# Patient Record
Sex: Female | Born: 1990 | Race: Black or African American | Hispanic: No | Marital: Single | Smoking: Never smoker
Health system: Southern US, Community
[De-identification: ages and names within clinical notes are randomized; demographics above are authoritative.]

## PROBLEM LIST (undated history)

## (undated) ENCOUNTER — Inpatient Hospital Stay (HOSPITAL_COMMUNITY): Payer: Self-pay

## (undated) DIAGNOSIS — O4290 Premature rupture of membranes, unspecified as to length of time between rupture and onset of labor, unspecified weeks of gestation: Secondary | ICD-10-CM

## (undated) DIAGNOSIS — J020 Streptococcal pharyngitis: Secondary | ICD-10-CM

## (undated) DIAGNOSIS — B999 Unspecified infectious disease: Secondary | ICD-10-CM

## (undated) DIAGNOSIS — O139 Gestational [pregnancy-induced] hypertension without significant proteinuria, unspecified trimester: Secondary | ICD-10-CM

## (undated) DIAGNOSIS — A749 Chlamydial infection, unspecified: Secondary | ICD-10-CM

## (undated) DIAGNOSIS — D649 Anemia, unspecified: Secondary | ICD-10-CM

## (undated) HISTORY — PX: WISDOM TOOTH EXTRACTION: SHX21

## (undated) HISTORY — PX: THERAPEUTIC ABORTION: SHX798

## (undated) HISTORY — DX: Unspecified infectious disease: B99.9

---
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
## 2009-02-27 ENCOUNTER — Ambulatory Visit: Payer: Self-pay | Admitting: Family Medicine

## 2009-02-27 ENCOUNTER — Encounter: Payer: Self-pay | Admitting: Family Medicine

## 2009-02-27 DIAGNOSIS — N898 Other specified noninflammatory disorders of vagina: Secondary | ICD-10-CM | POA: Insufficient documentation

## 2009-02-27 LAB — CONVERTED CEMR LAB
Chlamydia, DNA Probe: NEGATIVE
Glucose, Urine, Semiquant: NEGATIVE
Urobilinogen, UA: 0.2
WBC Urine, dipstick: NEGATIVE
pH: 6.5

## 2009-02-28 ENCOUNTER — Encounter: Payer: Self-pay | Admitting: Family Medicine

## 2009-03-04 ENCOUNTER — Ambulatory Visit: Payer: Self-pay | Admitting: Family Medicine

## 2009-03-28 ENCOUNTER — Emergency Department (HOSPITAL_COMMUNITY): Admission: EM | Admit: 2009-03-28 | Discharge: 2009-03-28 | Payer: Self-pay | Admitting: Emergency Medicine

## 2009-03-30 ENCOUNTER — Emergency Department (HOSPITAL_COMMUNITY): Admission: EM | Admit: 2009-03-30 | Discharge: 2009-03-30 | Payer: Self-pay | Admitting: Family Medicine

## 2009-05-28 ENCOUNTER — Ambulatory Visit: Payer: Self-pay | Admitting: Family Medicine

## 2009-05-28 ENCOUNTER — Encounter: Payer: Self-pay | Admitting: Family Medicine

## 2009-05-28 DIAGNOSIS — B373 Candidiasis of vulva and vagina: Secondary | ICD-10-CM | POA: Insufficient documentation

## 2009-05-28 DIAGNOSIS — D649 Anemia, unspecified: Secondary | ICD-10-CM

## 2009-05-28 LAB — CONVERTED CEMR LAB: Whiff Test: NEGATIVE

## 2009-05-29 ENCOUNTER — Encounter: Payer: Self-pay | Admitting: Family Medicine

## 2009-05-29 LAB — CONVERTED CEMR LAB: GC Probe Amp, Genital: NEGATIVE

## 2009-08-15 ENCOUNTER — Ambulatory Visit: Payer: Self-pay | Admitting: Family Medicine

## 2009-11-01 ENCOUNTER — Encounter: Payer: Self-pay | Admitting: *Deleted

## 2009-11-01 ENCOUNTER — Ambulatory Visit: Payer: Self-pay | Admitting: Family Medicine

## 2010-03-23 ENCOUNTER — Encounter: Payer: Self-pay | Admitting: Family Medicine

## 2010-04-01 NOTE — Assessment & Plan Note (Signed)
Summary: depo,tcb  Nurse Visit   Allergies: No Known Drug Allergies  Medication Administration  Injection # 1:    Medication: Depo-Provera 150mg     Diagnosis: CONTRACEPTIVE MANAGEMENT (ICD-V25.09)    Route: IM    Site: R deltoid    Exp Date: 06/2011    Lot #: [id number]    Mfr: Pharmacia    Comments: next Depo due March 21 thru June 03, 2009    Patient tolerated injection without complications    Given by: Theresia Lo RN (March 04, 2009 2:51 PM)  Orders Added: 1)  Depo-Provera 150mg  [J1055] 2)  Admin of Injection (IM/SQ) [19147]   Medication Administration  Injection # 1:    Medication: Depo-Provera 150mg     Diagnosis: CONTRACEPTIVE MANAGEMENT (ICD-V25.09)    Route: IM    Site: R deltoid    Exp Date: 06/2011    Lot #: [id number]    Mfr: Pharmacia    Comments: next Depo due March 21 thru June 03, 2009    Patient tolerated injection without complications    Given by: Theresia Lo RN (March 04, 2009 2:51 PM)  Orders Added: 1)  Depo-Provera 150mg  [J1055] 2)  Admin of Injection (IM/SQ) [13086]

## 2010-04-01 NOTE — Assessment & Plan Note (Signed)
Summary: cpe/depo,df   Vital Signs:  Patient profile:   20 year old female Weight:      101.9 pounds Temp:     98.7 degrees F oral Pulse rate:   90 / minute BP sitting:   108 / 74  (left arm) Cuff size:   regular  Vitals Entered By: Loralee Pacas CMA (May 28, 2009 8:57 AM)  CC:  cpe.  History of Present Illness: here for cpe.  has no concerns to discuss today  Current Medications (verified): 1)  Depo Shot Q 12 Weeks  Allergies: No Known Drug Allergies  Past History:  Past Medical History: Reviewed history from 01/03/2008 and no changes required. 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 G1P1  Family History: father--DM2 mother--healthy sister--DM2 paternal uncle with cancer--unknown type paternal GF (?colon cancer)  Social History: lives  her son (age 21)  [employer] studying nursing.  no  smoking, alcohol, or recreational drug use.  Sexually active  Review of Systems General:  Denies fever and weight loss. Resp:  Denies chest pain with inspiration. GU:  Denies abnormal vaginal bleeding.  Physical Exam  General:  Well-developed,well-nourished,in no acute distress; alert,appropriate and cooperative throughout examination Eyes:  fundoscopic exam grossly normal Ears:  tms clear Nose:  External nasal examination shows no deformity or inflammation. Nasal mucosa are pink and moist without lesions or exudates. Mouth:  Oral mucosa and oropharynx without lesions or exudates.  Teeth in good repair. Neck:  No deformities, masses, or tenderness noted. Breasts:  No mass, nodules, thickening, tenderness, bulging, retraction, inflamation, nipple discharge or skin changes noted.   Lungs:  Normal respiratory effort, chest expands symmetrically. Lungs are clear to auscultation, no crackles or wheezes. Heart:  Normal rate and regular rhythm. S1 and S2 normal  without gallop, murmur, click, rub or other extra sounds. Abdomen:  Bowel sounds positive,abdomen soft and non-tender without masses, organomegaly or hernias noted. Genitalia:  Pelvic Exam:        External: normal female genitalia without lesions or masses        Vagina: normal without lesions or masses        Cervix: normal without lesions or masses        Adnexa: normal bimanual exam without masses or fullness        Uterus: normal by palpation        Pap smear: not performed Msk:  No deformity or scoliosis noted of thoracic or lumbar spine.   Extremities:  No clubbing, cyanosis, edema, or deformity noted  Neurologic:  no focal deficits Skin:  Intact without suspicious lesions or rashes Axillary Nodes:  No palpable lymphadenopathy Psych:  Cognition and judgment appear intact. Alert and cooperative with normal attention span and concentration. No apparent delusions, illusions, hallucinations Additional Exam:  vital signs reviewed    Impression & Recommendations:  Problem # 1:  Preventive Health Care (ICD-V70.0) Assessment Unchanged fasting glucose done today is normal.  with 2 first degree relatives with diabetes, should be screened every 1-2 years.  Problem # 2:  Hx of ANEMIA (ICD-285.9) Assessment: Unchanged  hgb normal today at 13.8 Orders: Hemoglobin-[facility] (96045)  Problem # 3:  CONTACT OR EXPOSURE TO OTHER VIRAL DISEASES (ICD-V01.79) Assessment: Unchanged check for STDs as requested Orders: RPR-[facility] (000111000111) HIV-[facility] (40981-19147) GC/Chlamydia-[facility] (87591/87491)  Complete Medication List: 1)  Depo Shot Q 12 Weeks   Other Orders: Depo-Provera 150mg  (W2956)  Glucose Cap-[facility] (16109) Wet Prep- [facility] (60454) [facility] - Est  18-39 yrs (09811)  Patient Instructions: 1)  It was nice to see you today. 2)  I will call you or send you a letter with your lab results. 3)  Please schedule a follow-up appointment in 1 year.    Medication Administration  Injection # 1:     Medication: Depo-Provera 150mg     Diagnosis: CONTRACEPTIVE MANAGEMENT (ICD-V25.09)    Route: IM    Site: L deltoid    Exp Date: 07/01/2011    Lot #: [id number]    Mfr: [email or web address]    Comments: next injection due 08/13/2009 thru 08/27/2009    Patient tolerated injection without complications    Given by: Loralee Pacas CMA (May 28, 2009 9:10 AM)  Orders Added: 1)  Depo-Provera 150mg  [J1055] 2)  Hemoglobin-[facility] [85018] 3)  Glucose Cap-[facility] [82948] 4)  RPR-[facility] [86592-23940] 5)  HIV-[facility] [95621-30865] 6)  Wet Prep- [facility] [87210] 7)  GC/Chlamydia-[facility] [87591/87491] 8)  [facility] - Est  18-39 yrs [99395]   Laboratory Results   Blood Tests   Date/Time Received: May 28, 2009 9:45 AM  Date/Time Reported: May 28, 2009 10:08 AM     CBC   HGB:  13.8 g/dL   (Normal Range: 78.4-69.6 in Males, 12.0-15.0 in Females) Comments: ...........test performed by............Marland KitchenDewitt Hoes, [place](ASCP)10:08 AM entered by Terese Door, CMA    Date/Time Received: May 28, 2009 9:45 AM  Date/Time Reported: May 28, 2009 10:07 AM   [employer] Source: vaginal WBC/hpf: 0-3 Bacteria/hpf: 3+  Rods Clue cells/hpf: none  Negative whiff Yeast/hpf: none Trichomonas/hpf: none Comments: ...........test performed by...........Marland KitchenTerese Door, CMA    Appended Document: Orders Update    Clinical Lists Changes  Problems: Added new problem of WELL CHILD EXAMINATION (ICD-V20.2) Added new problem of ROUTINE GYNECOLOGICAL EXAMINATION (ICD-V72.31) Orders: Added new Test order of [facility] - Est  18-39 yrs [phone]) - Signed      Appended Document: Orders Update    Clinical Lists Changes  Orders: Added new Test order of [facility]- New 18-26yrs [phone]) - Signed

## 2010-04-01 NOTE — Assessment & Plan Note (Signed)
Summary: depo provera  Nurse Visit   Allergies: No Known Drug Allergies  Medication Administration  Injection # 1:    Medication: Depo-Provera 150mg     Diagnosis: CONTRACEPTIVE MANAGEMENT (ICD-V25.09)    Route: IM    Site: L deltoid    Exp Date: 04/2012    Lot #: [id number]    Mfr: [email or web address]    Comments: next depo due Nov 18 through Feb 16, 2010    Patient tolerated injection without complications    Given by: Theresia Lo RN (November 01, 2009 2:31 PM)  Orders Added: 1)  Depo-Provera 150mg  [J1055] 2)  Admin of Injection (IM/SQ) [63875]   Medication Administration  Injection # 1:    Medication: Depo-Provera 150mg     Diagnosis: CONTRACEPTIVE MANAGEMENT (ICD-V25.09)    Route: IM    Site: L deltoid    Exp Date: 04/2012    Lot #: [id number]    Mfr: [email or web address]    Comments: next depo due Nov 18 through Feb 16, 2010    Patient tolerated injection without complications    Given by: Theresia Lo RN (November 01, 2009 2:31 PM)  Orders Added: 1)  Depo-Provera 150mg  [J1055] 2)  Admin of Injection (IM/SQ) [51884]

## 2010-04-01 NOTE — Assessment & Plan Note (Signed)
Summary: depo inj,tcb  Nurse Visit   Allergies: No Known Drug Allergies  Medication Administration  Injection # 1:    Medication: Depo-Provera 150mg     Diagnosis: CONTRACEPTIVE MANAGEMENT (ICD-V25.09)    Route: IM    Site: R deltoid    Exp Date: 04/2012    Lot #: [id number]    Mfr: [email or web address]    Comments: next depo due Sept 1 thru Sept 15, 2011    Patient tolerated injection without complications    Given by: Theresia Lo RN (August 15, 2009 5:04 PM)  Orders Added: 1)  Depo-Provera 150mg  [J1055] 2)  Admin of Injection (IM/SQ) [04540]   Medication Administration  Injection # 1:    Medication: Depo-Provera 150mg     Diagnosis: CONTRACEPTIVE MANAGEMENT (ICD-V25.09)    Route: IM    Site: R deltoid    Exp Date: 04/2012    Lot #: [id number]    Mfr: [email or web address]    Comments: next depo due Sept 1 thru Sept 15, 2011    Patient tolerated injection without complications    Given by: Theresia Lo RN (August 15, 2009 5:04 PM)  Orders Added: 1)  Depo-Provera 150mg  [J1055] 2)  Admin of Injection (IM/SQ) [47829]

## 2010-04-01 NOTE — Letter (Signed)
Summary: Generic Letter  [facility]  [address]   [place], [place] [address]   Phone: [phone]  Fax: [phone]    05/29/2009  Shelley Mckinney [address] [place], [place]  [address]  Dear Ms. SHERLIN,   I just wanted to let you know that your lab results were normal.  Please call me if you have any questions or concerns.           Sincerely,   Asher Muir MD  Appended Document: Generic Letter mailed.

## 2010-04-08 ENCOUNTER — Emergency Department (HOSPITAL_COMMUNITY)
Admission: EM | Admit: 2010-04-08 | Discharge: 2010-04-08 | Disposition: A | Discharge status: Home / Self Care | Payer: Self-pay | Attending: Emergency Medicine | Admitting: Emergency Medicine

## 2010-04-08 DIAGNOSIS — Z043 Encounter for examination and observation following other accident: Secondary | ICD-10-CM | POA: Insufficient documentation

## 2010-04-18 ENCOUNTER — Encounter: Payer: Self-pay | Admitting: *Deleted

## 2010-05-18 LAB — WET PREP, GENITAL

## 2010-05-18 LAB — POCT URINALYSIS DIP (DEVICE)
Glucose, UA: NEGATIVE mg/dL
pH: 7 (ref 5.0–8.0)

## 2010-05-18 LAB — GC/CHLAMYDIA PROBE AMP, GENITAL
Chlamydia, DNA Probe: NEGATIVE
GC Probe Amp, Genital: NEGATIVE

## 2010-06-04 ENCOUNTER — Emergency Department (HOSPITAL_COMMUNITY)
Admission: EM | Admit: 2010-06-04 | Discharge: 2010-06-04 | Disposition: A | Discharge status: Home / Self Care | Payer: Worker's Compensation | Attending: Emergency Medicine | Admitting: Emergency Medicine

## 2010-06-04 DIAGNOSIS — T25229A Burn of second degree of unspecified foot, initial encounter: Secondary | ICD-10-CM | POA: Insufficient documentation

## 2010-06-04 DIAGNOSIS — T310 Burns involving less than 10% of body surface: Secondary | ICD-10-CM | POA: Insufficient documentation

## 2010-06-04 DIAGNOSIS — X118XXA Contact with other hot tap-water, initial encounter: Secondary | ICD-10-CM | POA: Insufficient documentation

## 2010-06-04 DIAGNOSIS — Y9289 Other specified places as the place of occurrence of the external cause: Secondary | ICD-10-CM | POA: Insufficient documentation

## 2010-06-04 DIAGNOSIS — M79609 Pain in unspecified limb: Secondary | ICD-10-CM | POA: Insufficient documentation

## 2010-06-05 LAB — GC/CHLAMYDIA PROBE AMP, GENITAL: GC Probe Amp, Genital: NEGATIVE

## 2010-06-05 LAB — WET PREP, GENITAL

## 2010-06-06 ENCOUNTER — Inpatient Hospital Stay (INDEPENDENT_AMBULATORY_CARE_PROVIDER_SITE_OTHER)
Admission: RE | Admit: 2010-06-06 | Discharge: 2010-06-06 | Disposition: A | Discharge status: Home / Self Care | Payer: Worker's Compensation | Source: Ambulatory Visit | Attending: Emergency Medicine | Admitting: Emergency Medicine

## 2010-06-06 DIAGNOSIS — T25029A Burn of unspecified degree of unspecified foot, initial encounter: Secondary | ICD-10-CM

## 2010-06-17 LAB — POCT URINALYSIS DIP (DEVICE)
Glucose, UA: NEGATIVE mg/dL
Nitrite: NEGATIVE
Specific Gravity, Urine: 1.015 (ref 1.005–1.030)
Urobilinogen, UA: 0.2 mg/dL (ref 0.0–1.0)

## 2010-06-17 LAB — POCT PREGNANCY, URINE: Preg Test, Ur: NEGATIVE

## 2010-06-17 LAB — GC/CHLAMYDIA PROBE AMP, GENITAL
Chlamydia, DNA Probe: NEGATIVE
GC Probe Amp, Genital: NEGATIVE

## 2010-06-20 ENCOUNTER — Emergency Department (HOSPITAL_COMMUNITY)
Admission: EM | Admit: 2010-06-20 | Discharge: 2010-06-20 | Disposition: A | Discharge status: Home / Self Care | Payer: Self-pay | Attending: Emergency Medicine | Admitting: Emergency Medicine

## 2010-06-20 DIAGNOSIS — O210 Mild hyperemesis gravidarum: Secondary | ICD-10-CM | POA: Insufficient documentation

## 2010-06-20 LAB — URINALYSIS, ROUTINE W REFLEX MICROSCOPIC
Glucose, UA: NEGATIVE mg/dL
Specific Gravity, Urine: 1.029 (ref 1.005–1.030)

## 2010-06-20 LAB — URINE MICROSCOPIC-ADD ON

## 2010-07-15 NOTE — Discharge Summary (Signed)
Shelley Mckinney, KELSAY                ACCOUNT NO.:  [id number]   MEDICAL RECORD NO.:  [id number]          PATIENT TYPE:  INP   LOCATION:  9117                          FACILITY:  [facility]   PHYSICIAN:  Norton Blizzard, MD    DATE OF BIRTH:  06-Nov-1990   DATE OF ADMISSION:  02/01/2007  DATE OF DISCHARGE:                               DISCHARGE SUMMARY   REASON FOR ADMISSION:  Onset of labor, spontaneous rupture of membranes.   PROCEDURES PRENATAL:  None.   PROCEDURES INTRAPARTUM:  Vacuum-assisted vaginal delivery.   PROCEDURES POSTPARTUM:  Rh immunoglobulin administration.   COMPLICATIONS, OPERATIVE AND POSTPARTUM:  None.   DISCHARGE DIAGNOSIS:  Term pregnancy delivered.   COMMENTS AND BRIEF HOSPITAL COURSE:  The patient is a 20 year old G1, P0  who presented at 6 and four-sevenths weeks following spontaneous  rupture of membranes.  The patient was a patient of the [facility] with onset of prenatal care at 13 weeks.  She was Rh negative and  GBS negative with a routine prenatal course.  The patient was admitted  to labor and delivery for expected management following rupture with  clear fluid return.  The patient was monitored overnight with  appropriate progression of her labor.  After reaching full cervical  dilation and effacement the patient commenced pushing for approximately  an hour to an hour-and-a-half.  The infant was occiput posterior.  Following the hour of pushing and secondary to maternal fatigue, Dr.  Okey Dupre was called and decision was made to use vacuum assistance to clear  the pelvic bone.  Vacuum was applied in the usual manner and the patient  delivered infant with one push over an intact perineum.  A single  shoulder cord was reduced.  Infant was bulb-suctioned at delivery.  Immediate cry was returned and infant's Apgars were 8 and 9 at one and  five minutes respectively.  Infant was handed to nurse.  The patient  delivered placenta spontaneously.   Three-vessel cord.  There were no  lacerations.  Estimated blood loss was minimal.  The patient and infant  were both stable following delivery.  Depo-Provera and RhoGAM were  administered to the patient postpartum per patient request and protocol,  respectively.  The patient had a routine postpartum course and was  discharged home on postpartum day #2.  Discharge hemoglobin was 9.0.  The patient is breast-feeding infant.  She is scheduled for followup  with [facility] with Dr. Vena Austria in 5 days.     Towana Badger, M.D.      Norton Blizzard, MD  Electronically Signed   JP/MEDQ  D:  02/04/2007  T:  02/04/2007  Job:  [id number]

## 2010-11-20 LAB — WET PREP, GENITAL: WBC, Wet Prep HPF POC: NONE SEEN

## 2010-11-20 LAB — POCT URINALYSIS DIP (DEVICE)
Hgb urine dipstick: NEGATIVE
Protein, ur: 30 — AB
Specific Gravity, Urine: 1.025
Urobilinogen, UA: 1

## 2010-11-20 LAB — GC/CHLAMYDIA PROBE AMP, GENITAL: Chlamydia, DNA Probe: NEGATIVE

## 2010-12-08 LAB — CBC
Hemoglobin: 11 — ABNORMAL LOW
MCV: 85.3
RBC: 3.18 — ABNORMAL LOW
RBC: 3.97
RDW: 15.8 — ABNORMAL HIGH
WBC: 11.5

## 2010-12-08 LAB — RH IMMUNE GLOB WKUP(>/=20WKS)(NOT WOMEN'S HOSP)

## 2010-12-08 LAB — RPR: RPR Ser Ql: NONREACTIVE

## 2010-12-09 LAB — URINALYSIS, DIPSTICK ONLY
Ketones, ur: NEGATIVE
Leukocytes, UA: NEGATIVE
Nitrite: NEGATIVE
Protein, ur: 30 — AB
Urobilinogen, UA: 0.2
pH: 7.5

## 2010-12-09 LAB — URINALYSIS, ROUTINE W REFLEX MICROSCOPIC
Bilirubin Urine: NEGATIVE
Ketones, ur: >80 — AB
Nitrite: NEGATIVE
Protein, ur: NEGATIVE
Urobilinogen, UA: 0.2

## 2010-12-09 LAB — COMPREHENSIVE METABOLIC PANEL
ALT: 17
AST: 29
Albumin: 2.6 — ABNORMAL LOW
Calcium: 9.3
Potassium: 3.9
Sodium: 133 — ABNORMAL LOW
Total Protein: 6.2

## 2010-12-09 LAB — CBC
MCHC: 32.9
MCV: 83.1
RDW: 16 — ABNORMAL HIGH

## 2011-06-11 ENCOUNTER — Encounter (HOSPITAL_COMMUNITY): Payer: Self-pay | Admitting: Emergency Medicine

## 2011-06-11 ENCOUNTER — Emergency Department (INDEPENDENT_AMBULATORY_CARE_PROVIDER_SITE_OTHER)
Admission: EM | Admit: 2011-06-11 | Discharge: 2011-06-11 | Disposition: A | Discharge status: Home / Self Care | Payer: Self-pay | Source: Home / Self Care | Attending: Emergency Medicine | Admitting: Emergency Medicine

## 2011-06-11 DIAGNOSIS — Z202 Contact with and (suspected) exposure to infections with a predominantly sexual mode of transmission: Secondary | ICD-10-CM

## 2011-06-11 LAB — POCT URINALYSIS DIP (DEVICE)
Protein, ur: 30 mg/dL — AB
Specific Gravity, Urine: 1.025 (ref 1.005–1.030)
Urobilinogen, UA: 1 mg/dL (ref 0.0–1.0)
pH: 5.5 (ref 5.0–8.0)

## 2011-06-11 MED ORDER — AZITHROMYCIN 250 MG PO TABS
1000.0000 mg | ORAL_TABLET | Freq: Once | ORAL | Status: AC
  Administered 2011-06-11: 1000 mg via ORAL

## 2011-06-11 NOTE — ED Notes (Signed)
CALLED PT AND TOLD HER THAT LAB DISCARDED GC/WET PREP CX DUE TO NON LABEL SPECIMEN.PT WAS TREATED WITH 1,000MG  AZITHROMYCIN PO  FOR  EXPOSURE OF CHLAMYDIA  FROM PARTNER.TOLD PT THE SITUATION AND TOLD HER TO RETURN IN 1 WEEK IF ANY SX APPEAR PER DR.COLL.PT VERBALIZED UNDERSTANDING

## 2011-06-11 NOTE — ED Provider Notes (Signed)
History     CSN: [id number]  Arrival date & time 06/11/11  1433   First MD Initiated Contact with Patient 06/11/11 1556      Chief Complaint  Patient presents with  . Exposure to STD    (Consider location/radiation/quality/duration/timing/severity/associated sxs/prior treatment) HPI Comments: Patient wants to be treated, for STD as her boyfriend, was diagnosed with chlamydia. She has no, symptoms, no discharge, no dysuria, no pelvic pain  Patient is a 21 y.o. female presenting with STD exposure. The history is provided by the patient.  Exposure to STD This is a new problem. The problem has not changed since onset.Pertinent negatives include no headaches. The symptoms are aggravated by nothing. The symptoms are relieved by nothing. She has tried nothing for the symptoms. The treatment provided no relief.    History reviewed. No pertinent past medical history.  History reviewed. No pertinent past surgical history.  No family history on file.  History  Substance Use Topics  . Smoking status: Not on file  . Smokeless tobacco: Not on file  . Alcohol Use: Not on file    OB History    Grav Para Term Preterm Abortions TAB SAB Ect Mult Living                  Review of Systems  Constitutional: Negative for activity change.  Genitourinary: Negative for dysuria, decreased urine volume, vaginal bleeding, vaginal discharge, vaginal pain, pelvic pain and dyspareunia.  Neurological: Negative for facial asymmetry, weakness, light-headedness and headaches.    Allergies  Review of patient's allergies indicates no known allergies.  Home Medications   Current Outpatient Rx  Name Route Sig Dispense Refill  . MEDROXYPROGESTERONE ACETATE 150 MG/ML IM SUSP Intramuscular Inject 150 mg into the muscle every 3 (three) months.        BP 107/58  Pulse 84  Temp(Src) 98.4 F (36.9 C) (Oral)  Resp 14  SpO2 100%  LMP 06/10/2011  Physical Exam  Nursing note and vitals  reviewed. Constitutional: She appears well-developed and well-nourished.  HENT:  Head: Normocephalic.  Mouth/Throat: No oropharyngeal exudate.  Eyes: Conjunctivae are normal.  Musculoskeletal: Normal range of motion. She exhibits no edema and no tenderness.  Neurological: She is alert. She displays normal reflexes. No cranial nerve deficit. She exhibits normal muscle tone. Coordination normal.  Skin: Skin is warm. No erythema.    ED Course  Procedures (including critical care time)  Labs Reviewed  POCT URINALYSIS DIP (DEVICE) - Abnormal; Notable for the following:    Bilirubin Urine SMALL (*)    Ketones, ur >=160 (*)    Hgb urine dipstick MODERATE (*)    Protein, ur 30 (*)    All other components within normal limits  POCT PREGNANCY, URINE   No results found.   1. Sexually transmitted disease exposure       MDM  Partner diagnosed- with chlamydia- approached by nurse, labs obtained but problem with identifiers.        Jimmie Molly, MD 06/11/11 [phone]

## 2011-06-11 NOTE — ED Notes (Signed)
PT HERE FOR STD CHECK WITH EXPOSURE FROM BOYFRIEND.PT WAS TOLD YESTERDAY PARTNER HAS CHLAMYDIA  BUT DENIES ANY SX.LMP YESTERDAY.

## 2011-06-13 ENCOUNTER — Emergency Department (HOSPITAL_COMMUNITY)
Admission: EM | Admit: 2011-06-13 | Discharge: 2011-06-13 | Disposition: A | Discharge status: Home / Self Care | Payer: Self-pay | Attending: Emergency Medicine | Admitting: Emergency Medicine

## 2011-06-13 ENCOUNTER — Encounter (HOSPITAL_COMMUNITY): Payer: Self-pay | Admitting: Family Medicine

## 2011-06-13 DIAGNOSIS — R209 Unspecified disturbances of skin sensation: Secondary | ICD-10-CM | POA: Insufficient documentation

## 2011-06-13 DIAGNOSIS — R631 Polydipsia: Secondary | ICD-10-CM | POA: Insufficient documentation

## 2011-06-13 DIAGNOSIS — E876 Hypokalemia: Secondary | ICD-10-CM | POA: Insufficient documentation

## 2011-06-13 LAB — GLUCOSE, CAPILLARY: Glucose-Capillary: 115 mg/dL — ABNORMAL HIGH (ref 70–99)

## 2011-06-13 LAB — POCT I-STAT, CHEM 8
Chloride: 106 mEq/L (ref 96–112)
Creatinine, Ser: 0.7 mg/dL (ref 0.50–1.10)
Hemoglobin: 13.6 g/dL (ref 12.0–15.0)
Potassium: 3.2 mEq/L — ABNORMAL LOW (ref 3.5–5.1)
Sodium: 141 mEq/L (ref 135–145)

## 2011-06-13 MED ORDER — POTASSIUM CHLORIDE ER 10 MEQ PO TBCR: 10.0000 meq | EXTENDED_RELEASE_TABLET | Freq: Two times a day (BID) | ORAL | Status: DC

## 2011-06-13 NOTE — ED Notes (Signed)
Pt sts she has had increased thirst, numbness in fingers, and not feeling herself. sts diabetes runs in family and wants to be checked.

## 2011-06-13 NOTE — ED Provider Notes (Signed)
History     CSN: [id number]  Arrival date & time 06/13/11  1847   First MD Initiated Contact with Patient 06/13/11 2015      Chief Complaint  Patient presents with  . Numbness  . Polydipsia    (Consider location/radiation/quality/duration/timing/severity/associated sxs/prior treatment) HPI Comments: Patient here with a week history of increased thirst and tingling sensation in her fingers, and states that she does not feel herself and is concerned that she may have diabetes. States that diabetes runs in her family. Denies polyuria, polyphagia, states has not been today. Denies fevers chills, headache, blurred vision, chest pain, shortness of breath, abdominal pain, nausea, vomiting, diarrhea, constipation.  Patient is a 21 y.o. female presenting with neurologic complaint. The history is provided by the patient. No language interpreter was used.  Neurologic Problem The primary symptoms include paresthesias. Primary symptoms do not include headaches, syncope, loss of consciousness, altered mental status, seizures, dizziness, visual change, focal weakness, loss of sensation, speech change, memory loss, fever, nausea or vomiting. The symptoms began yesterday. The symptoms are unchanged. The neurological symptoms are diffuse.  Additional symptoms do not include neck stiffness, weakness, pain, lower back pain, leg pain, loss of balance, photophobia, aura, hallucinations, nystagmus, taste disturbance, hyperacusis, hearing loss, tinnitus, vertigo, anxiety, irritability or dysphoric mood. Medical issues do not include seizures, cerebral vascular accident, alcohol use, drug use, diabetes, hypertension or recent surgery.    History reviewed. No pertinent past medical history.  History reviewed. No pertinent past surgical history.  History reviewed. No pertinent family history.  History  Substance Use Topics  . Smoking status: Never Smoker   . Smokeless tobacco: Not on file  . Alcohol Use: No      OB History    Grav Para Term Preterm Abortions TAB SAB Ect Mult Living                  Review of Systems  Constitutional: Negative for fever and irritability.  HENT: Negative for hearing loss, neck stiffness and tinnitus.   Eyes: Negative for photophobia.  Cardiovascular: Negative for syncope.  Gastrointestinal: Negative for nausea and vomiting.  Neurological: Positive for paresthesias. Negative for dizziness, vertigo, speech change, focal weakness, seizures, loss of consciousness, weakness, headaches and loss of balance.  Psychiatric/Behavioral: Negative for hallucinations, memory loss, dysphoric mood and altered mental status.  All other systems reviewed and are negative.    Allergies  Review of patient's allergies indicates no known allergies.  Home Medications   Current Outpatient Rx  Name Route Sig Dispense Refill  . MEDROXYPROGESTERONE ACETATE 150 MG/ML IM SUSP Intramuscular Inject 150 mg into the muscle every 3 (three) months.        BP 107/87  Pulse 110  Temp(Src) 97.8 F (36.6 C) (Oral)  Resp 20  SpO2 100%  LMP 06/10/2011  Physical Exam  Nursing note and vitals reviewed. Constitutional: She is oriented to person, place, and time. She appears well-developed and well-nourished. No distress.  HENT:  Head: Normocephalic and atraumatic.  Right Ear: External ear normal.  Left Ear: External ear normal.  Nose: Nose normal.  Mouth/Throat: Oropharynx is clear and moist. No oropharyngeal exudate.  Eyes: Conjunctivae are normal. Pupils are equal, round, and reactive to light. No scleral icterus.  Neck: Normal range of motion. Neck supple.  Cardiovascular: Regular rhythm and normal heart sounds.  Exam reveals no gallop and no friction rub.   No murmur heard.      Tachycardia  Pulmonary/Chest: Effort normal and breath sounds  normal. No respiratory distress. She has no wheezes. She has no rales. She exhibits no tenderness.  Abdominal: Soft. Bowel sounds are normal.  She exhibits no distension and no mass. There is no tenderness. There is no rebound and no guarding.  Musculoskeletal: Normal range of motion. She exhibits no edema and no tenderness.  Neurological: She is alert and oriented to person, place, and time. No cranial nerve deficit. She exhibits normal muscle tone. Coordination normal.  Skin: Skin is warm and dry. No rash noted. No erythema. No pallor.  Psychiatric: She has a normal mood and affect. Her behavior is normal. Judgment and thought content normal.    ED Course  Procedures (including critical care time)  Labs Reviewed  GLUCOSE, CAPILLARY - Abnormal; Notable for the following:    Glucose-Capillary 115 (*)    All other components within normal limits  POCT I-STAT, CHEM 8 - Abnormal; Notable for the following:    Potassium 3.2 (*)    All other components within normal limits   No results found. Results for orders placed during the hospital encounter of 06/13/11  GLUCOSE, CAPILLARY      Component Value Range   Glucose-Capillary 115 (*) 70 - 99 (mg/dL)   Comment 1 Documented in Chart     Comment 2 Notify RN    POCT I-STAT, CHEM 8      Component Value Range   Sodium 141  135 - 145 (mEq/L)   Potassium 3.2 (*) 3.5 - 5.1 (mEq/L)   Chloride 106  96 - 112 (mEq/L)   BUN 11  6 - 23 (mg/dL)   Creatinine, Ser 1.61  0.50 - 1.10 (mg/dL)   Glucose, Bld 83  70 - 99 (mg/dL)   Calcium, Ion 0.96  1.12 - 1.32 (mmol/L)   TCO2 24  0 - 100 (mmol/L)   Hemoglobin 13.6  12.0 - 15.0 (g/dL)   HCT 04.5  40.9 - 81.1 (%)   No results found.    Hypokalemia    MDM  Patient with vague complaints of not feeling herself and episodic numbness in bilateral fingers with increased thirst, no evidence of diabetes. I-STAT shows potassium of 3.2 mildly low, which I will treat. Heart rate initially tachycardic, now down below 100 at discharge. She will followup at the health department for further evaluation, I do not find any emergency medical condition at  this time.        Izola Price [place], [place] 06/13/11 2158

## 2011-06-13 NOTE — Discharge Instructions (Signed)

## 2011-06-14 NOTE — ED Provider Notes (Signed)
Medical screening examination/treatment/procedure(s) were performed by non-physician practitioner and as supervising physician I was immediately available for consultation/collaboration.  Tamala Manzer L Audie Stayer, MD 06/14/11 0115 

## 2011-08-25 ENCOUNTER — Encounter (HOSPITAL_COMMUNITY): Payer: Self-pay | Admitting: *Deleted

## 2011-08-25 ENCOUNTER — Emergency Department (HOSPITAL_COMMUNITY)
Admission: EM | Admit: 2011-08-25 | Discharge: 2011-08-25 | Disposition: A | Discharge status: Home / Self Care | Payer: Self-pay | Attending: Emergency Medicine | Admitting: Emergency Medicine

## 2011-08-25 DIAGNOSIS — B373 Candidiasis of vulva and vagina: Secondary | ICD-10-CM

## 2011-08-25 DIAGNOSIS — B3731 Acute candidiasis of vulva and vagina: Secondary | ICD-10-CM | POA: Insufficient documentation

## 2011-08-25 LAB — URINALYSIS, ROUTINE W REFLEX MICROSCOPIC
Bilirubin Urine: NEGATIVE
Glucose, UA: NEGATIVE mg/dL
Ketones, ur: NEGATIVE mg/dL
Nitrite: NEGATIVE
pH: 7 (ref 5.0–8.0)

## 2011-08-25 LAB — URINE MICROSCOPIC-ADD ON

## 2011-08-25 LAB — WET PREP, GENITAL: Trich, Wet Prep: NONE SEEN

## 2011-08-25 MED ORDER — CEFTRIAXONE SODIUM 250 MG IJ SOLR
250.0000 mg | Freq: Once | INTRAMUSCULAR | Status: AC
  Administered 2011-08-25: 250 mg via INTRAMUSCULAR
  Filled 2011-08-25: qty 250

## 2011-08-25 MED ORDER — MICONAZOLE NITRATE 2 % VA CREA: 1.0000 | TOPICAL_CREAM | Freq: Every day | VAGINAL | Status: AC

## 2011-08-25 MED ORDER — AZITHROMYCIN 250 MG PO TABS
1000.0000 mg | ORAL_TABLET | Freq: Once | ORAL | Status: AC
  Administered 2011-08-25: 1000 mg via ORAL
  Filled 2011-08-25: qty 3
  Filled 2011-08-25: qty 1

## 2011-08-25 NOTE — ED Notes (Signed)
Pt from home with reports of vaginal itching as well as white discharge with an odor that started 2 days ago. Pt denies obvious sores or rash to vaginal area.

## 2011-08-25 NOTE — ED Provider Notes (Signed)
History     CSN: [id number]  Arrival date & time 08/25/11  1126   First MD Initiated Contact with Patient 08/25/11 1212      Chief Complaint  Patient presents with  . Vaginal Itching  . Vaginal Discharge    (Consider location/radiation/quality/duration/timing/severity/associated sxs/prior treatment) HPI  Patient states she is prone to recurrent yeast infections presents to emergency department complaining of a few day history of vaginal itching with white vaginal discharge that she states has a strong odor. Patient states that she has a prescription for Diflucan with multiple refills that she uses when she gets recurrent yeast infections. Patient states she took a Diflucan last night but woke today and had persistent vaginal itching. Patient states she called the [facility] STD clinic and was told that they did not have any openings today and therefore she presented to the emergency department for further evaluation. Patient states she has been having unprotected sex with multiple partners and is also potentially concern for STD. She denies fevers, chills, abdominal pain, n/v, dyspareunia, pelvic pain, genital lesions, dysuria, hematuria or blood in stool. Symptoms were gradual onset, persistent and unchanging. She denies aggravating or alleviating factors. She is G4P1A1 with LMP June 10th. She has no other complaints.   History reviewed. No pertinent past medical history.  History reviewed. No pertinent past surgical history.  History reviewed. No pertinent family history.  History  Substance Use Topics  . Smoking status: Never Smoker   . Smokeless tobacco: Never Used  . Alcohol Use: No    OB History    Grav Para Term Preterm Abortions TAB SAB Ect Mult Living                  Review of Systems  All other systems reviewed and are negative.    Allergies  Review of patient's allergies indicates no known allergies.  Home Medications  No current outpatient  prescriptions on file.  BP 122/66  Pulse 86  Temp 98.1 F (36.7 C) (Oral)  Resp 16  SpO2 100%  LMP 08/10/2011  Physical Exam  Nursing note and vitals reviewed. Constitutional: She is oriented to person, place, and time. She appears well-developed and well-nourished. No distress.  HENT:  Head: Normocephalic and atraumatic.  Eyes: Conjunctivae are normal.  Cardiovascular: Normal rate, regular rhythm, normal heart sounds and intact distal pulses.  Exam reveals no gallop and no friction rub.   No murmur heard. Pulmonary/Chest: Effort normal and breath sounds normal. No respiratory distress. She has no wheezes. She has no rales. She exhibits no tenderness.  Abdominal: Soft. Bowel sounds are normal. She exhibits no distension and no mass. There is no tenderness. There is no rebound and no guarding.  Genitourinary: Uterus normal. Vaginal discharge found.       Thick white vaginal d/c with clear mucus from cervix. No CMT. No adnexal TTP or mass or fullness.   No genital lesions.   Musculoskeletal: Normal range of motion. She exhibits no edema and no tenderness.  Neurological: She is alert and oriented to person, place, and time.  Skin: Skin is warm and dry. No rash noted. She is not diaphoretic. No erythema.  Psychiatric: She has a normal mood and affect.    ED Course  Procedures (including critical care time)  Exam more consistent with yeast infection but given risky sexual activity and concern for STD, will prophylactically treat for GC and chlamydia.   IM rocpehin, PO zithromax.  Negative POC u preg confirmed as  negative though did not cross into system   Labs Reviewed  WET PREP, GENITAL - Abnormal; Notable for the following:    Yeast Wet Prep HPF POC FEW (*)     Clue Cells Wet Prep HPF POC FEW (*)     WBC, Wet Prep HPF POC MODERATE (*)     All other components within normal limits  URINALYSIS, ROUTINE W REFLEX MICROSCOPIC - Abnormal; Notable for the following:    APPearance  CLOUDY (*)     Leukocytes, UA TRACE (*)     All other components within normal limits  URINE MICROSCOPIC-ADD ON - Abnormal; Notable for the following:    Squamous Epithelial / LPF FEW (*)     Bacteria, UA FEW (*)     All other components within normal limits  GC/CHLAMYDIA PROBE AMP, GENITAL   No results found.   1. Candidiasis of vulva and vagina       MDM  Afebrile and non toxic appearing. abdomen is soft and non tender. No CMT or adnexal TTP.         [place], [place] 08/25/11 1419

## 2011-08-25 NOTE — Discharge Instructions (Signed)
Use miconazole as directed for the next week but following up with a gynecologist in the near future to discuss recurrent yeast infections is VERY important. Follow up with [facility] STD clinic to be screened for HIV in the future and for future STD concerns or screenings. This is the recommendation by the CDC for people with multiple sexual partners or hx of STDs. You have been treated for gonorrhea and chlamydia in the ER but the hospital will call you if lab is positive.    Candidal Vulvovaginitis Candidal vulvovaginitis is an infection of the vagina and vulva. The vulva is the skin around the opening of the vagina. This may cause itching and discomfort in and around the vagina.  HOME CARE  Only take medicine as told by your doctor.   Do not have sex (intercourse) until the infection is healed or as told by your doctor.   Practice safe sex.   Tell your sex partner about your infection.   Do not douche or use tampons.   Wear cotton underwear. Do not wear tight pants or panty hose.   Eat yogurt. This may help treat and prevent yeast infections.  GET HELP RIGHT AWAY IF:   You have a fever.   Your problems get worse during treatment or do not get better in 3 days.   You have discomfort, irritation, or itching in your vagina or vulva area.   You have pain after sex.   You start to get belly (abdominal) pain.  MAKE SURE YOU:  Understand these instructions.   Will watch your condition.   Will get help right away if you are not doing well or get worse.  Document Released: 05/15/2008 Document Revised: 02/05/2011 Document Reviewed: 05/15/2008 [facility] Patient Information 2012 [place], [place].

## 2011-08-25 NOTE — ED Provider Notes (Signed)
Medical screening examination/treatment/procedure(s) were performed by non-physician practitioner and as supervising physician I was immediately available for consultation/collaboration.   Celene Kras, MD 08/25/11 [phone]

## 2011-08-26 LAB — GC/CHLAMYDIA PROBE AMP, GENITAL: Chlamydia, DNA Probe: NEGATIVE

## 2011-10-15 ENCOUNTER — Emergency Department (HOSPITAL_COMMUNITY)
Admission: EM | Admit: 2011-10-15 | Discharge: 2011-10-15 | Disposition: A | Discharge status: Home / Self Care | Payer: Self-pay | Attending: Emergency Medicine | Admitting: Emergency Medicine

## 2011-10-15 ENCOUNTER — Encounter (HOSPITAL_COMMUNITY): Payer: Self-pay | Admitting: Adult Health

## 2011-10-15 DIAGNOSIS — R112 Nausea with vomiting, unspecified: Secondary | ICD-10-CM | POA: Insufficient documentation

## 2011-10-15 DIAGNOSIS — J020 Streptococcal pharyngitis: Secondary | ICD-10-CM

## 2011-10-15 DIAGNOSIS — J3489 Other specified disorders of nose and nasal sinuses: Secondary | ICD-10-CM | POA: Insufficient documentation

## 2011-10-15 DIAGNOSIS — R599 Enlarged lymph nodes, unspecified: Secondary | ICD-10-CM | POA: Insufficient documentation

## 2011-10-15 HISTORY — DX: Streptococcal pharyngitis: J02.0

## 2011-10-15 LAB — RAPID STREP SCREEN (MED CTR MEBANE ONLY): Streptococcus, Group A Screen (Direct): NEGATIVE

## 2011-10-15 MED ORDER — OXYCODONE-ACETAMINOPHEN 5-325 MG PO TABS
2.0000 | ORAL_TABLET | Freq: Once | ORAL | Status: AC
  Administered 2011-10-15: 2 via ORAL
  Filled 2011-10-15: qty 2

## 2011-10-15 MED ORDER — PENICILLIN V POTASSIUM 500 MG PO TABS: 500.0000 mg | ORAL_TABLET | Freq: Four times a day (QID) | ORAL | Status: AC

## 2011-10-15 MED ORDER — DEXAMETHASONE SODIUM PHOSPHATE 10 MG/ML IJ SOLN
10.0000 mg | Freq: Once | INTRAMUSCULAR | Status: AC
  Administered 2011-10-15: 10 mg
  Filled 2011-10-15: qty 1

## 2011-10-15 MED ORDER — IBUPROFEN 400 MG PO TABS
400.0000 mg | ORAL_TABLET | Freq: Once | ORAL | Status: AC
  Administered 2011-10-15: 400 mg via ORAL
  Filled 2011-10-15: qty 1

## 2011-10-15 NOTE — ED Notes (Signed)
C/o sore throat that began this morning and is associated with chills, nausea. Throat is red.

## 2011-10-15 NOTE — ED Provider Notes (Signed)
History  Scribed for Raeford Razor, MD, the patient was seen in room TR07C/TR07C. This chart was scribed by Candelaria Stagers. The patient's care started at 7:09 PM   CSN: [id number]  Arrival date & time 10/15/11  1724   First MD Initiated Contact with Patient 10/15/11 1850      Chief Complaint  Patient presents with  . Sore Throat     The history is provided by the patient.   Shelley Mckinney is a 21 y.o. female who presents to the Emergency Department complaining of a sore throat that started this morning.  Pt denies SOB, cough, fever, or chills.  She is also experiencing nausea, vomiting, and congestion.  Pt has not had ill contacts.  She reports h/o of strep throat.  Nothing seems to make the pain better or worse.   Past Medical History  Diagnosis Date  . Strep throat     History reviewed. No pertinent past surgical history.  History reviewed. No pertinent family history.  History  Substance Use Topics  . Smoking status: Never Smoker   . Smokeless tobacco: Never Used  . Alcohol Use: No    OB History    Grav Para Term Preterm Abortions TAB SAB Ect Mult Living                  Review of Systems  Constitutional: Negative for fever and chills.       10 Systems reviewed and are negative for acute change except as noted in the HPI.  HENT: Positive for congestion and sore throat.   Eyes: Negative for discharge and redness.  Respiratory: Negative for cough and shortness of breath.   Cardiovascular: Negative for chest pain.  Gastrointestinal: Positive for nausea and vomiting. Negative for abdominal pain.  Musculoskeletal: Negative for back pain.  Skin: Negative for rash.  Neurological: Negative for syncope, numbness and headaches.  Psychiatric/Behavioral:       No behavior change.    Allergies  Review of patient's allergies indicates no known allergies.  Home Medications   Current Outpatient Rx  Name Route Sig Dispense Refill  . ACETAMINOPHEN 160 MG/5ML PO LIQD  Oral Take 320 mg by mouth once.      BP 114/57  Pulse 110  Temp 98.9 F (37.2 C) (Oral)  Resp 16  SpO2 100%  Physical Exam  Nursing note and vitals reviewed. Constitutional:       Awake, alert, nontoxic appearance.  HENT:  Head: Atraumatic.  Right Ear: Tympanic membrane normal.  Left Ear: Tympanic membrane normal.  Mouth/Throat: Oropharyngeal exudate present.       Bilateral tonsillar hypertrophy.  Handling secretions.  Uvula midline.    Eyes: Right eye exhibits no discharge. Left eye exhibits no discharge.  Neck: Neck supple.  Cardiovascular:       Mildly tachycardic   Pulmonary/Chest: Effort normal. She exhibits no tenderness.  Abdominal: Soft. There is no tenderness. There is no rebound.  Musculoskeletal: She exhibits no tenderness.       Baseline ROM, no obvious new focal weakness.  Lymphadenopathy:    She has cervical adenopathy (right sided).  Neurological:       Mental status and motor strength appears baseline for patient and situation.  Skin: No rash noted.  Psychiatric: She has a normal mood and affect.    ED Course  Procedures   DIAGNOSTIC STUDIES: Oxygen Saturation is 100% on room air, normal by my interpretation.    COORDINATION OF CARE:   1732 Ordered:  Rapid strep screen    Labs Reviewed  RAPID STREP SCREEN  LAB REPORT - SCANNED   No results found.   1. Strep throat       MDM  21 year old female with sore throat. Three out of four central criteria. Despite negative strep screen, will treat with antibiotics. No evidence of deep space infection. Return precautions were discussed.  I personally preformed the services scribed in my presence. The recorded information has been reviewed and considered. Raeford Razor, MD.         Raeford Razor, MD 10/23/11 [phone]

## 2011-12-31 ENCOUNTER — Emergency Department (HOSPITAL_COMMUNITY)
Admission: EM | Admit: 2011-12-31 | Discharge: 2011-12-31 | Disposition: A | Discharge status: Home / Self Care | Payer: Medicaid Other | Attending: Emergency Medicine | Admitting: Emergency Medicine

## 2011-12-31 ENCOUNTER — Encounter (HOSPITAL_COMMUNITY): Payer: Self-pay | Admitting: *Deleted

## 2011-12-31 DIAGNOSIS — Z3201 Encounter for pregnancy test, result positive: Secondary | ICD-10-CM | POA: Insufficient documentation

## 2011-12-31 DIAGNOSIS — Z3490 Encounter for supervision of normal pregnancy, unspecified, unspecified trimester: Secondary | ICD-10-CM

## 2011-12-31 MED ORDER — FOLIC ACID 800 MCG PO TABS: 400.0000 ug | ORAL_TABLET | Freq: Every day | ORAL | Status: DC

## 2011-12-31 NOTE — ED Provider Notes (Signed)
History     CSN: [id number]  Arrival date & time 12/31/11  1129   First MD Initiated Contact with Patient 12/31/11 1150      Chief Complaint  Patient presents with  . wants preg test     (Consider location/radiation/quality/duration/timing/severity/associated sxs/prior treatment) HPI Comments: Patient presents today requesting a pregnancy test so that she can "have proof for Medicaid."  LMP was 11/26/11.  She denies any abdominal pain or pelvic pain.  Denies nausea or vomiting.  She denies increased urinary frequency, dysuria, or urgency.  She denies fever or chills.  No vaginal discharge.  She reports that she noticed a small amount of blood on the toilet paper when she wiped yesterday morning.  No vaginal bleeding since that time.  No blood clots.  She is G5P1.  She reports that she has had three elective abortions in the past.  No history of previous ectopic pregnancies.  No prior history of PID.     The history is provided by the patient.    Past Medical History  Diagnosis Date  . Strep throat     History reviewed. No pertinent past surgical history.  History reviewed. No pertinent family history.  History  Substance Use Topics  . Smoking status: Never Smoker   . Smokeless tobacco: Never Used  . Alcohol Use: No    OB History    Grav Para Term Preterm Abortions TAB SAB Ect Mult Living                  Review of Systems  Constitutional: Negative for fever and chills.  Gastrointestinal: Negative for nausea and vomiting.  Genitourinary: Negative for dysuria, urgency, frequency, vaginal discharge, vaginal pain and pelvic pain.    Allergies  Review of patient's allergies indicates no known allergies.  Home Medications  No current outpatient prescriptions on file.  BP 120/62  Pulse 104  Temp 98.8 F (37.1 C) (Oral)  Resp 16  SpO2 100%  Physical Exam  Nursing note and vitals reviewed. Constitutional: She appears well-developed and well-nourished. No  distress.  HENT:  Head: Normocephalic and atraumatic.  Mouth/Throat: Oropharynx is clear and moist.  Neck: Normal range of motion. Neck supple.  Cardiovascular: Normal rate, regular rhythm and normal heart sounds.   Pulmonary/Chest: Effort normal and breath sounds normal.  Abdominal: Soft. Bowel sounds are normal. She exhibits no distension and no mass. There is no tenderness. There is no rebound and no guarding.  Genitourinary:       Pelvic deferred by patient  Neurological: She is alert.  Skin: Skin is warm and dry. She is not diaphoretic.  Psychiatric: She has a normal mood and affect.    ED Course  Procedures (including critical care time)  Labs Reviewed  POCT PREGNANCY, URINE - Abnormal; Notable for the following:    Preg Test, Ur POSITIVE (*)     All other components within normal limits   No results found.   No diagnosis found.    MDM  Patient requesting a pregnancy test for proof for medicaid.  Pregnancy test positive.  Patient does not any abdominal pain or pelvic pain.  No prior history of ectopic pregnancies.  Therefore, feel that patient can follow up with OB/GYN.          Pascal Lux [place], PA-C 12/31/11 1734

## 2011-12-31 NOTE — ED Notes (Signed)
Pt reports scant vaginal bleeding yesterday, pt had positive pregnancy test on Monday. Came to ED for confirmation. Denies pain

## 2011-12-31 NOTE — ED Notes (Signed)
Pt alert and oriented x4. Respirations even and unlabored, bilateral symmetrical rise and fall of chest. Skin warm and dry. In no acute distress. Denies needs.   

## 2012-01-01 NOTE — ED Provider Notes (Signed)
Medical screening examination/treatment/procedure(s) were performed by non-physician practitioner and as supervising physician I was immediately available for consultation/collaboration.  Deonne Rooks L Jaceyon Strole, MD 01/01/12 0950 

## 2012-01-11 ENCOUNTER — Inpatient Hospital Stay (HOSPITAL_COMMUNITY): Payer: Medicaid Other

## 2012-01-11 ENCOUNTER — Inpatient Hospital Stay (HOSPITAL_COMMUNITY)
Admission: AD | Admit: 2012-01-11 | Discharge: 2012-01-11 | Disposition: A | Discharge status: Home / Self Care | Payer: Medicaid Other | Source: Ambulatory Visit | Attending: Obstetrics and Gynecology | Admitting: Obstetrics and Gynecology

## 2012-01-11 ENCOUNTER — Encounter (HOSPITAL_COMMUNITY): Payer: Self-pay

## 2012-01-11 DIAGNOSIS — Z298 Encounter for other specified prophylactic measures: Secondary | ICD-10-CM | POA: Insufficient documentation

## 2012-01-11 DIAGNOSIS — A499 Bacterial infection, unspecified: Secondary | ICD-10-CM | POA: Insufficient documentation

## 2012-01-11 DIAGNOSIS — O200 Threatened abortion: Secondary | ICD-10-CM

## 2012-01-11 DIAGNOSIS — Z2989 Encounter for other specified prophylactic measures: Secondary | ICD-10-CM | POA: Insufficient documentation

## 2012-01-11 DIAGNOSIS — B9689 Other specified bacterial agents as the cause of diseases classified elsewhere: Secondary | ICD-10-CM

## 2012-01-11 DIAGNOSIS — N760 Acute vaginitis: Secondary | ICD-10-CM

## 2012-01-11 DIAGNOSIS — O2390 Unspecified genitourinary tract infection in pregnancy, unspecified trimester: Secondary | ICD-10-CM | POA: Insufficient documentation

## 2012-01-11 DIAGNOSIS — O3680X0 Pregnancy with inconclusive fetal viability, not applicable or unspecified: Secondary | ICD-10-CM

## 2012-01-11 DIAGNOSIS — R109 Unspecified abdominal pain: Secondary | ICD-10-CM | POA: Insufficient documentation

## 2012-01-11 HISTORY — DX: Chlamydial infection, unspecified: A74.9

## 2012-01-11 LAB — WET PREP, GENITAL
Trich, Wet Prep: NONE SEEN
Yeast Wet Prep HPF POC: NONE SEEN

## 2012-01-11 LAB — CBC
HCT: 35.9 % — ABNORMAL LOW (ref 36.0–46.0)
Hemoglobin: 12.2 g/dL (ref 12.0–15.0)
MCV: 82 fL (ref 78.0–100.0)
Platelets: 176 10*3/uL (ref 150–400)
RBC: 4.38 MIL/uL (ref 3.87–5.11)
WBC: 5.7 10*3/uL (ref 4.0–10.5)

## 2012-01-11 MED ORDER — METRONIDAZOLE 500 MG PO TABS: 500.0000 mg | ORAL_TABLET | Freq: Two times a day (BID) | ORAL | Status: DC

## 2012-01-11 MED ORDER — RHO D IMMUNE GLOBULIN 1500 UNIT/2ML IJ SOLN
300.0000 ug | Freq: Once | INTRAMUSCULAR | Status: AC
  Administered 2012-01-11: 300 ug via INTRAMUSCULAR
  Filled 2012-01-11: qty 2

## 2012-01-11 NOTE — MAU Note (Signed)
Had preg confirmed at [facility] on 10/31.  States never had the cramping with other preg, wants [place] to make sure everything is ok.

## 2012-01-11 NOTE — MAU Note (Signed)
Patient states that she is in to get an ultrasound to verify that her baby is okay. She states that she had light vaginal spotting last week (she states that she noticed blood after voiding), no bleeding today. She denies any pain or discomfort.

## 2012-01-11 NOTE — MAU Provider Note (Signed)
Attestation of Attending Supervision of Advanced Practitioner (CNM/NP): Evaluation and management procedures were performed by the Advanced Practitioner under my supervision and collaboration.  I have reviewed the Advanced Practitioner's note and chart, and I agree with the management and plan.  Solan Vosler 01/11/2012 5:20 PM

## 2012-01-11 NOTE — MAU Note (Signed)
Lower abd cramping past couple days, feels like cycle is going to start, but it doesn't.  Was due on 28th.  Has done HPT (last wk) was positive.

## 2012-01-11 NOTE — MAU Provider Note (Signed)
History     CSN: [id number]  Arrival date and time: 01/11/12 1255   None     Chief Complaint  Patient presents with  . Abdominal Pain  . Possible Pregnancy   HPI Shelley Mckinney is 21 y.o. [phone] [redacted]w[redacted]d weeks presenting with intermittent pain, "not everyday".  Spotted last last week.  Has not tried anything for pain.  Describes pain when it comes as 'Light cramping".  Denies pain or bleeding at this time.  Plans care with [facility].  Asking for verification letter.    Past Medical History  Diagnosis Date  . Strep throat   . Chlamydia     History reviewed. No pertinent past surgical history.  History reviewed. No pertinent family history.  History  Substance Use Topics  . Smoking status: Never Smoker   . Smokeless tobacco: Never Used  . Alcohol Use: No    Allergies: No Known Allergies  No prescriptions prior to admission    Review of Systems  Constitutional: Negative.   HENT: Negative.   Respiratory: Negative.   Cardiovascular: Negative.   Gastrointestinal: Positive for abdominal pain (intermittent--last pain 1 week ago).  Genitourinary:       Spotting 1 week ago, none since   Physical Exam   Blood pressure 102/61, pulse 100, temperature 98.4 F (36.9 C), temperature source Oral, resp. rate 16, height 4' 9.25" (1.454 m), weight 99 lb (44.906 kg), last menstrual period 01/11/2012.  Physical Exam  Constitutional: She is oriented to person, place, and time. She appears well-developed and well-nourished. No distress.  HENT:  Head: Normocephalic.  Neck: Normal range of motion.  Respiratory: Effort normal.  GI: Soft. There is no tenderness.  Genitourinary: There is no tenderness or lesion on the right labia. There is no tenderness or lesion on the left labia. Uterus is not enlarged and not tender. Cervix exhibits no motion tenderness and no discharge. Right adnexum displays no mass, no tenderness and no fullness. Left adnexum displays no mass, no tenderness and no  fullness. No bleeding around the vagina. Vaginal discharge (scant white with no odor) found.  Neurological: She is alert and oriented to person, place, and time.  Skin: Skin is warm and dry.    Results for orders placed during the hospital encounter of 01/11/12 (from the past 24 hour(s))  CBC     Status: Abnormal   Collection Time   01/11/12  1:30 PM      Component Value Range   WBC 5.7  4.0 - 10.5 K/uL   RBC 4.38  3.87 - 5.11 MIL/uL   Hemoglobin 12.2  12.0 - 15.0 g/dL   HCT 14.7 (*) 82.9 - 56.2 %   MCV 82.0  78.0 - 100.0 fL   MCH 27.9  26.0 - 34.0 pg   MCHC 34.0  30.0 - 36.0 g/dL   RDW 13.0  86.5 - 78.4 %   Platelets 176  150 - 400 K/uL  ABO/RH     Status: Normal (Preliminary result)   Collection Time   01/11/12  1:30 PM      Component Value Range   ABO/RH(D) B NEG    HCG, QUANTITATIVE, PREGNANCY     Status: Abnormal   Collection Time   01/11/12  1:30 PM      Component Value Range   hCG, Beta Chain, Quant, S 15791 (*) <5 mIU/mL  RH IG WORKUP (INCLUDES ABO/RH)     Status: Normal (Preliminary result)   Collection Time   01/11/12  1:30 PM      Component Value Range   Gestational Age(Wks) 6     ABO/RH(D) B NEG     Antibody Screen NEG     Unit Number [id number]     Blood Component Type RHIG     Unit division 00     Status of Unit ISSUED     Transfusion Status OK TO TRANSFUSE    WET PREP, GENITAL     Status: Abnormal   Collection Time   01/11/12  3:00 PM      Component Value Range   Yeast Wet Prep HPF POC NONE SEEN  NONE SEEN   Trich, Wet Prep NONE SEEN  NONE SEEN   Clue Cells Wet Prep HPF POC MODERATE (*) NONE SEEN   WBC, Wet Prep HPF POC FEW (*) NONE SEEN    MAU Course  Procedures  MDM 14:15 went into room to evaluate patient and she was in Ultrasound. After examination and review of labs and ultrasound, I explained to the patient the concern.  Will give RHophylac--had bleeding last week and possibility of threatened ab.   Rhophylac given.    Assessment and  Plan  A:  Irregular gestational sac at [redacted]w[redacted]d gestation      B Negative blood type with hx of bleeding 1 week ago     Bacterial vaginosis  P:  Repeat ultrasound next week--radiology will call her with app       Pelvic rest      Rhophylac given     Will delay verification letter until follow up ultrasound     Return for severe pain or heavy vaginal bleeding.    Rx Flagyl 500mg  bid X 1 week  Reo Portela,EVE M 01/11/2012, 4:37 PM

## 2012-01-12 LAB — RH IG WORKUP (INCLUDES ABO/RH): Gestational Age(Wks): 6

## 2012-01-12 LAB — GC/CHLAMYDIA PROBE AMP, GENITAL: GC Probe Amp, Genital: NEGATIVE

## 2012-01-15 ENCOUNTER — Inpatient Hospital Stay (HOSPITAL_COMMUNITY)
Admission: AD | Admit: 2012-01-15 | Discharge: 2012-01-15 | Disposition: A | Discharge status: Home / Self Care | Payer: Medicaid Other | Source: Ambulatory Visit | Attending: Obstetrics and Gynecology | Admitting: Obstetrics and Gynecology

## 2012-01-15 ENCOUNTER — Encounter (HOSPITAL_COMMUNITY): Payer: Self-pay | Admitting: *Deleted

## 2012-01-15 DIAGNOSIS — O219 Vomiting of pregnancy, unspecified: Secondary | ICD-10-CM

## 2012-01-15 DIAGNOSIS — O210 Mild hyperemesis gravidarum: Secondary | ICD-10-CM | POA: Insufficient documentation

## 2012-01-15 LAB — URINALYSIS, ROUTINE W REFLEX MICROSCOPIC
Hgb urine dipstick: NEGATIVE
Protein, ur: NEGATIVE mg/dL
Urobilinogen, UA: 0.2 mg/dL (ref 0.0–1.0)

## 2012-01-15 LAB — URINE MICROSCOPIC-ADD ON

## 2012-01-15 MED ORDER — PROMETHAZINE HCL 25 MG/ML IJ SOLN
12.5000 mg | INTRAMUSCULAR | Status: DC | PRN
  Administered 2012-01-15: 25 mg via INTRAVENOUS
  Filled 2012-01-15: qty 1

## 2012-01-15 MED ORDER — LACTATED RINGERS IV SOLN
INTRAVENOUS | Status: DC
  Administered 2012-01-15: 20:00:00 via INTRAVENOUS

## 2012-01-15 MED ORDER — ONDANSETRON HCL 8 MG PO TABS: 8.0000 mg | ORAL_TABLET | Freq: Three times a day (TID) | ORAL | Status: DC | PRN

## 2012-01-15 MED ORDER — PROMETHAZINE HCL 25 MG PO TABS: 25.0000 mg | ORAL_TABLET | Freq: Four times a day (QID) | ORAL | Status: DC | PRN

## 2012-01-15 NOTE — MAU Provider Note (Signed)
History     CSN: [id number]  Arrival date and time: 01/15/12 1901   First Provider Initiated Contact with Patient 01/15/12 2038      Chief Complaint  Patient presents with  . Emesis During Pregnancy   HPI This is a 21 y.o. female at [redacted]w[redacted]d who presents with a 3 day history of nausea and vomiting. Denies fever, abdominal pain or diarrhea.  Gets prenatal care at the [facility].    OB History    Grav Para Term Preterm Abortions TAB SAB Ect Mult Living   5 1 1  3 3    1       Past Medical History  Diagnosis Date  . Strep throat   . Chlamydia     No past surgical history on file.  No family history on file.  History  Substance Use Topics  . Smoking status: Never Smoker   . Smokeless tobacco: Never Used  . Alcohol Use: No    Allergies: No Known Allergies  Prescriptions prior to admission  Medication Sig Dispense Refill  . metroNIDAZOLE (FLAGYL) 500 MG tablet Take 1 tablet (500 mg total) by mouth 2 (two) times daily.  14 tablet  0    ROS See HPI  Physical Exam   Blood pressure 118/62, pulse 104, temperature 98.3 F (36.8 C), temperature source Oral, resp. rate 20, height 4\' 10"  (1.473 m), weight 97 lb (43.999 kg), last menstrual period 01/11/2012.  Physical Exam  Constitutional: She is oriented to person, place, and time. She appears well-developed and well-nourished. No distress.  Cardiovascular: Normal rate.   Respiratory: Effort normal.  GI: Soft. She exhibits no distension and no mass. There is no tenderness. There is no rebound and no guarding.  Musculoskeletal: Normal range of motion.  Neurological: She is alert and oriented to person, place, and time.  Skin: Skin is warm and dry.  Psychiatric: She has a normal mood and affect.    MAU Course  Procedures  MDM Results for orders placed during the hospital encounter of 01/15/12 (from the past 24 hour(s))  URINALYSIS, ROUTINE W REFLEX MICROSCOPIC     Status: Abnormal   Collection Time   01/15/12  7:20 PM      Component Value Range   Color, Urine YELLOW  YELLOW   APPearance CLOUDY (*) CLEAR   Specific Gravity, Urine 1.025  1.005 - 1.030   pH 6.5  5.0 - 8.0   Glucose, UA NEGATIVE  NEGATIVE mg/dL   Hgb urine dipstick NEGATIVE  NEGATIVE   Bilirubin Urine NEGATIVE  NEGATIVE   Ketones, ur 40 (*) NEGATIVE mg/dL   Protein, ur NEGATIVE  NEGATIVE mg/dL   Urobilinogen, UA 0.2  0.0 - 1.0 mg/dL   Nitrite NEGATIVE  NEGATIVE   Leukocytes, UA TRACE (*) NEGATIVE  URINE MICROSCOPIC-ADD ON     Status: Abnormal   Collection Time   01/15/12  7:20 PM      Component Value Range   Squamous Epithelial / LPF MANY (*) RARE   WBC, UA 7-10  <3 WBC/hpf   Bacteria, UA MANY (*) RARE   Urine-Other MUCOUS PRESENT      IV fluids and Phenergan given. >> felt better after Phenergan and one bag of IVF.  Able to tolerate POs  Assessment and Plan  A:  Nausea and vomiting      SIUP at [redacted]w[redacted]d  P:  Discharge home       Rx Phenergan for night use and Zofran for daytime  Followup in clinic  [facility] 01/15/2012, 9:09 PM

## 2012-01-15 NOTE — MAU Note (Signed)
Vomiting for 2 days. No diarrhea

## 2012-01-17 LAB — URINE CULTURE: Colony Count: 9000

## 2012-01-17 NOTE — MAU Provider Note (Signed)
Attestation of Attending Supervision of Advanced Practitioner: Evaluation and management procedures were performed by the PA/NP/CNM/OB Fellow under my supervision/collaboration. Chart reviewed and agree with management and plan.  Kara Melching V 01/17/2012 10:30 PM

## 2012-01-18 ENCOUNTER — Inpatient Hospital Stay (HOSPITAL_COMMUNITY)
Admission: AD | Admit: 2012-01-18 | Discharge: 2012-01-18 | Disposition: A | Discharge status: Home / Self Care | Payer: Medicaid Other | Source: Ambulatory Visit | Attending: Obstetrics & Gynecology | Admitting: Obstetrics & Gynecology

## 2012-01-18 ENCOUNTER — Ambulatory Visit (HOSPITAL_COMMUNITY)
Admission: RE | Admit: 2012-01-18 | Discharge: 2012-01-18 | Disposition: A | Discharge status: Home / Self Care | Payer: Medicaid Other | Source: Ambulatory Visit | Attending: Gynecology | Admitting: Gynecology

## 2012-01-18 ENCOUNTER — Ambulatory Visit (HOSPITAL_COMMUNITY): Payer: MEDICAID

## 2012-01-18 ENCOUNTER — Encounter (HOSPITAL_COMMUNITY): Payer: Self-pay | Admitting: Advanced Practice Midwife

## 2012-01-18 DIAGNOSIS — O99891 Other specified diseases and conditions complicating pregnancy: Secondary | ICD-10-CM | POA: Insufficient documentation

## 2012-01-18 DIAGNOSIS — Z3689 Encounter for other specified antenatal screening: Secondary | ICD-10-CM | POA: Insufficient documentation

## 2012-01-18 DIAGNOSIS — Z3490 Encounter for supervision of normal pregnancy, unspecified, unspecified trimester: Secondary | ICD-10-CM

## 2012-01-18 DIAGNOSIS — O3680X0 Pregnancy with inconclusive fetal viability, not applicable or unspecified: Secondary | ICD-10-CM

## 2012-01-18 NOTE — MAU Provider Note (Signed)
  History     CSN: [id number]  Arrival date and time: 01/18/12 1219   None     Chief Complaint  Patient presents with  . Follow-up   HPI 21 y.o. [id number] at [redacted]w[redacted]d here for f/u ultrasound for viability. Denies pain or bleeding at this time.   Past Medical History  Diagnosis Date  . Strep throat   . Chlamydia     No past surgical history on file.  No family history on file.  History  Substance Use Topics  . Smoking status: Never Smoker   . Smokeless tobacco: Never Used  . Alcohol Use: No    Allergies: No Known Allergies  No prescriptions prior to admission    Review of Systems  Constitutional: Negative.   Respiratory: Negative.   Cardiovascular: Negative.   Gastrointestinal: Negative.   Genitourinary: Negative.    Physical Exam   Blood pressure 116/64, pulse 110, temperature 98.3 F (36.8 C), temperature source Oral, resp. rate 16, last menstrual period 01/11/2012.  Physical Exam  Nursing note and vitals reviewed. Constitutional: She is oriented to person, place, and time. She appears well-developed and well-nourished. No distress.  Cardiovascular: Normal rate.   Respiratory: Effort normal.  Neurological: She is alert and oriented to person, place, and time.  Skin: Skin is warm and dry.  Psychiatric: She has a normal mood and affect.    MAU Course  Procedures  U/S: 6.3 week IUP, + FHR  Assessment and Plan   1. Normal intrauterine pregnancy on prenatal ultrasound       Medication List     As of 01/18/2012  6:27 PM    CONTINUE taking these medications         metroNIDAZOLE 500 MG tablet   Commonly known as: FLAGYL   Take 1 tablet (500 mg total) by mouth 2 (two) times daily.      ondansetron 8 MG tablet   Commonly known as: ZOFRAN   Take 1 tablet (8 mg total) by mouth every 8 (eight) hours as needed for nausea.      promethazine 25 MG tablet   Commonly known as: PHENERGAN   Take 1 tablet (25 mg total) by mouth every 6 (six) hours as  needed for nausea.          Follow-up Information    Follow up with provider of  your choice. (start prenatal care as soon as possible)            Sharmaine Bain 01/18/2012, 6:27 PM

## 2012-01-18 NOTE — MAU Note (Signed)
Patient to MAU after ultrasound to confirm viability. Patient denies any pain or bleeding.  

## 2012-01-18 NOTE — MAU Provider Note (Signed)
Attestation of Attending Supervision of Advanced Practitioner (CNM/NP): Evaluation and management procedures were performed by the Advanced Practitioner under my supervision and collaboration.  I have reviewed the Advanced Practitioner's note and chart, and I agree with the management and plan.  HARRAWAY-SMITH, Yvon Mccord 10:00 PM     

## 2012-02-17 ENCOUNTER — Ambulatory Visit (INDEPENDENT_AMBULATORY_CARE_PROVIDER_SITE_OTHER): Payer: Medicaid Other | Admitting: Obstetrics and Gynecology

## 2012-02-17 DIAGNOSIS — Z331 Pregnant state, incidental: Secondary | ICD-10-CM

## 2012-02-17 DIAGNOSIS — Z36 Encounter for antenatal screening of mother: Secondary | ICD-10-CM

## 2012-02-17 LAB — POCT URINALYSIS DIPSTICK
Glucose, UA: NEGATIVE
Ketones, UA: NEGATIVE
Spec Grav, UA: 1.015
Urobilinogen, UA: NEGATIVE

## 2012-02-17 NOTE — Progress Notes (Signed)
NOB interview. U/S results WITH VL. EDC to be based in U/S from 01/18/12.  Pt desires 1st trimester screen which was scheduled.

## 2012-02-18 LAB — ANTIBODY TITER (PRENATAL TITER)

## 2012-02-18 LAB — PRENATAL ANTIBODY IDENTIFICATION

## 2012-02-18 LAB — PRENATAL PANEL VII
Antibody Screen: POSITIVE — AB
Basophils Relative: 0 % (ref 0–1)
Eosinophils Absolute: 0.2 10*3/uL (ref 0.0–0.7)
Eosinophils Relative: 3 % (ref 0–5)
HCT: 34.5 % — ABNORMAL LOW (ref 36.0–46.0)
Hemoglobin: 11.7 g/dL — ABNORMAL LOW (ref 12.0–15.0)
MCH: 27.7 pg (ref 26.0–34.0)
MCHC: 33.9 g/dL (ref 30.0–36.0)
Monocytes Absolute: 0.6 10*3/uL (ref 0.1–1.0)
Monocytes Relative: 9 % (ref 3–12)
Rh Type: NEGATIVE

## 2012-02-19 LAB — HEMOGLOBINOPATHY EVALUATION: Hgb S Quant: 0 %

## 2012-02-19 LAB — CULTURE, OB URINE
Colony Count: NO GROWTH
Organism ID, Bacteria: NO GROWTH

## 2012-03-02 DIAGNOSIS — D649 Anemia, unspecified: Secondary | ICD-10-CM

## 2012-03-02 DIAGNOSIS — O139 Gestational [pregnancy-induced] hypertension without significant proteinuria, unspecified trimester: Secondary | ICD-10-CM

## 2012-03-02 HISTORY — DX: Gestational (pregnancy-induced) hypertension without significant proteinuria, unspecified trimester: O13.9

## 2012-03-02 HISTORY — DX: Anemia, unspecified: D64.9

## 2012-03-02 NOTE — L&D Delivery Note (Signed)
Delivery Note Anterior lip noted just before 2100, and pushed for about 30 min, while trying to reduce it.  Station about +1, and variables noted, so at 2125, IUPC placed, stopped pushing for rest and descent, and repositioned pt in Rt exaggerated SIMS.  Pt received 1gm po Tylenol at 2141 secondary to Temp=99.6 orally and IVFB.  Rechecked cx at 2304 and c/c/+3.  Pushing restarted, and at 11:11 PM a viable female "Shelley Mckinney" was delivered via Vaginal, Spontaneous Delivery (Presentation: Right Occiput Anterior).  APGAR: 7, 9; weight TBA .  LNCx1 reduced over head prior to delivery of body.  Immediately placed on mom's abdomen where dried and stimulated, and newborn slow to cry, but clamped cord x2 and cut by FOB, and transferred to warmer, and newborn then w/ lusty cry.   Placenta status: Intact, Spontaneous, Schultz w/ trailing membranes.  Cord: 3 vessels with the following complications: None.  Cord pH: not collected.  Anesthesia: Epidural  Episiotomy: None Lacerations: Rt Periurethral (repaired w/ 2 interrupted stitches) Suture Repair: 4-0 Est. Blood Loss (mL): 300  Mom to postpartum.  Baby to nursery-stable. FOB also doing skin to skin. Planning outpatient circumcision.  Pt's BP borderline high (max=147/89) during last few hrs of labor and during recovery; Will do PIH labs tomorrow morning and CTO closely. RN to leave epidural cath in place until platelets checked in AM w/ labs.  Dymphna Wadley H 08/24/2012, 11:49 PM

## 2012-03-04 ENCOUNTER — Other Ambulatory Visit: Payer: Self-pay | Admitting: Obstetrics and Gynecology

## 2012-03-04 ENCOUNTER — Ambulatory Visit (INDEPENDENT_AMBULATORY_CARE_PROVIDER_SITE_OTHER): Payer: Medicaid Other

## 2012-03-04 DIAGNOSIS — Z36 Encounter for antenatal screening of mother: Secondary | ICD-10-CM

## 2012-03-04 LAB — US OB COMP LESS 14 WKS

## 2012-03-17 ENCOUNTER — Encounter: Payer: Self-pay | Admitting: Obstetrics and Gynecology

## 2012-03-17 ENCOUNTER — Ambulatory Visit: Payer: Medicaid Other | Admitting: Obstetrics and Gynecology

## 2012-03-17 VITALS — BP 98/50 | Wt 102.0 lb

## 2012-03-17 DIAGNOSIS — Z331 Pregnant state, incidental: Secondary | ICD-10-CM

## 2012-03-17 DIAGNOSIS — Z3689 Encounter for other specified antenatal screening: Secondary | ICD-10-CM

## 2012-03-17 DIAGNOSIS — Z124 Encounter for screening for malignant neoplasm of cervix: Secondary | ICD-10-CM

## 2012-03-17 LAB — POCT WET PREP (WET MOUNT)
Whiff Test: NEGATIVE
pH: 4.5

## 2012-03-17 NOTE — Progress Notes (Addendum)
[facility] NEW OB EXAMINATION   JOSELLE DEEDS is a 22 y.o. female, [id number], who presents at [redacted]w[redacted]d gestation for a new obstetrical examination. The patient had spotting at the beginning of her pregnancy.  On January 11, 2012 the patient had an ultrasound done at 6 weeks and 1 day gestation.  Her due date is September 02, 2012.  The patient's blood type is B-.  She was given RhoGAM.  She has confidential pregnancy that she does not want shared with others.  The following portions of the patient's history were reviewed and updated as appropriate: allergies, current medications, past family history, past medical history, past social history, past surgical history and problem list.  OB History    Grav Para Term Preterm Abortions TAB SAB Ect Mult Living   5 1 1  3 3    1       Past Medical History  Diagnosis Date  . Strep throat   . Chlamydia   . Infection     PARTNER + CHLAMYDIA  . Infection     FREQUENT YEAST  . Infection     BV    Past Surgical History  Procedure Date  . No past surgeries     Family History  Problem Relation Age of Onset  . Drug abuse Mother   . Drug abuse Father   . Diabetes Sister   . Down syndrome Maternal Uncle   . Cancer Paternal Uncle   . Diabetes Paternal Grandmother   . Cancer Paternal Grandfather     COLON  . Down syndrome Cousin     PATERNAL    Social History:  reports that she has never smoked. She has never used smokeless tobacco. She reports that she drinks alcohol. She reports that she does not use illicit drugs.  Allergies: No Known Allergies  Medications: prenatal vitamins   Objective:    BP 98/50  Wt 102 lb (46.267 kg)  LMP 01/11/2012    Weight:  Wt Readings from Last 1 Encounters:  03/17/12 102 lb (46.267 kg)          BMI: There is no height on file to calculate BMI.  General Appearance: Alert, appropriate appearance for age. No acute distress HEENT: Grossly normal Neck / Thyroid: Supple, no masses, nodes or enlargement Lungs:  clear to auscultation bilaterally Back: No CVA tenderness Breast Exam: No masses or nodes.No dimpling, nipple retraction or discharge. Cardiovascular: Regular rate and rhythm. S1, S2, no murmur Gastrointestinal: Soft, non-tender, no masses or organomegaly.                               Fundal height: 16 weeks                               Fetal heart tones audible: yes; 155 bpm.  ++++++++++++++++++++++++++++++++++++++++++++++++++++++++  Pelvic Exam: External genitalia: normal general appearance Vaginal: normal without tenderness, induration or masses and relaxation: Yes Cervix: normal appearance Adnexa: normal bimanual exam Uterus: gravid, nontender, 16 weeks size  ++++++++++++++++++++++++++++++++++++++++++++++++++++++++  Lymphatic Exam: Non-palpable nodes in neck, clavicular, axillary, or inguinal regions Neurologic: Normal speech, no tremor  Psychiatric: Alert and oriented, appropriate affect.  Prenatal labs: ABO, Rh: B/NEG/-- (12/18 1425) Antibody: POS (12/18 1425) Rubella:  immune RPR: NON REAC (12/18 1425)  HBsAg: NEGATIVE (12/18 1425)  HIV: NON REACTIVE (12/18 1425)  GBS:   pending until the third trimester  Gonorrhea: Negative Chlamydia: Negative Wet prep: PH 4.5, clue cells negative, Whiff negative. Sickle cell: Adult hemoglobin Hemoglobin: 11.7 Platelets: 198,000 Urine culture: Negative   Assessment:   22 y.o. female G5P1031 at [redacted]w[redacted]d gestation ( EDC is September 02, 2012) by: Normal Last menstrual period: no Ultrasound:                               January 11, 2012 at 6 weeks and 1 day                                Late prenatal care  B negative  Positive antibody screen the patient received RhoGAM in November  Confidential pregnancy history   Plan:    We discussed routine pregnancy issues:  Toxoplasmosis was reviewed.  The patient was told to avoid cat liter boxes and feces.  The patient was told to avoid predator fish including tuna because of our  concerns for mercury consumption.  The patient was told to avoid soft cheeses.  The patient was told to be sure that all lunch meats are well cooked.  Genetic screening was discussed. AFP today. First trimester screen on March 04, 2012: Within normal limits.  Our model for pregnancy management was reviewed.  Proper diet and exercise reviewed.  Return to office in 4 weeks.  Medications include:  Prenatal vitamins  Anatomy ultrasound next visit.  Pap smear sent.  Mylinda Latina.D.

## 2012-03-17 NOTE — Progress Notes (Deleted)
Pt does not want to discuss her number of previous pregnancies.

## 2012-03-19 LAB — PAP IG, CT-NG, RFX HPV ASCU

## 2012-04-14 ENCOUNTER — Encounter: Payer: Medicaid Other | Admitting: Obstetrics and Gynecology

## 2012-04-15 ENCOUNTER — Encounter: Payer: Self-pay | Admitting: Obstetrics and Gynecology

## 2012-04-15 ENCOUNTER — Other Ambulatory Visit: Payer: Medicaid Other

## 2012-04-15 ENCOUNTER — Ambulatory Visit: Payer: Medicaid Other | Admitting: Obstetrics and Gynecology

## 2012-04-15 ENCOUNTER — Ambulatory Visit: Payer: Medicaid Other

## 2012-04-15 VITALS — BP 100/60 | Wt 105.0 lb

## 2012-04-15 DIAGNOSIS — Z331 Pregnant state, incidental: Secondary | ICD-10-CM

## 2012-04-15 DIAGNOSIS — Z1389 Encounter for screening for other disorder: Secondary | ICD-10-CM

## 2012-04-15 DIAGNOSIS — Z3689 Encounter for other specified antenatal screening: Secondary | ICD-10-CM

## 2012-04-15 NOTE — Progress Notes (Signed)
[redacted]w[redacted]d [place] s=d cx 3.04 cm SIUP with 3vc.  Anterior placenta.   Normal fluid No anomalies First rirmester scren WNL.  AFP today RT 4 weeks

## 2012-04-15 NOTE — Progress Notes (Signed)
[redacted]w[redacted]d No complaints today.

## 2012-04-19 LAB — US OB COMP + 14 WK

## 2012-04-19 LAB — ALPHA FETOPROTEIN, MATERNAL
AFP: 63 IU/mL
Curr Gest Age: 19.5 wks.days
MoM for AFP: 1

## 2012-04-25 ENCOUNTER — Inpatient Hospital Stay (HOSPITAL_COMMUNITY)
Admission: AD | Admit: 2012-04-25 | Discharge: 2012-04-26 | Disposition: A | Discharge status: Home / Self Care | Payer: Medicaid Other | Source: Ambulatory Visit | Attending: Obstetrics and Gynecology | Admitting: Obstetrics and Gynecology

## 2012-04-25 DIAGNOSIS — O26859 Spotting complicating pregnancy, unspecified trimester: Secondary | ICD-10-CM | POA: Insufficient documentation

## 2012-04-25 NOTE — MAU Note (Signed)
Pt states at 2200-when she went to the BR and wiped,there was a pinksih discharge on the toilet paper

## 2012-04-26 ENCOUNTER — Encounter (HOSPITAL_COMMUNITY): Payer: Self-pay | Admitting: *Deleted

## 2012-04-26 ENCOUNTER — Inpatient Hospital Stay (HOSPITAL_COMMUNITY): Payer: Medicaid Other

## 2012-04-26 DIAGNOSIS — O26859 Spotting complicating pregnancy, unspecified trimester: Secondary | ICD-10-CM | POA: Diagnosis present

## 2012-04-26 MED ORDER — RHO D IMMUNE GLOBULIN 1500 UNIT/2ML IJ SOLN
300.0000 ug | Freq: Once | INTRAMUSCULAR | Status: DC
  Filled 2012-04-26: qty 2

## 2012-04-26 NOTE — MAU Provider Note (Signed)
History   22 yo G5P1031 at 21 2/7 weeks presented unannounced c/o pink spotting twice tonight after wiping--initially had BM and saw vaginal spotting, then voided later with same spotting noted.  Denies pain or recent IC.  Had anatomy scan on 2/18 with anterior placenta and cervical length of 3.04.  Received Rhogam on 03/12/12 for early spotting.  Patient Active Problem List  Diagnosis  . CANDIDIASIS OF VULVA AND VAGINA  . ANEMIA  . VAGINAL DISCHARGE  . Pregnant state, incidental  . Rh negative state in antepartum period  . Spotting during pregnancy     Chief Complaint  Patient presents with  . Vaginal Bleeding     OB History   Grav Para Term Preterm Abortions TAB SAB Ect Mult Living   5 1 1  3 3    1       Past Medical History  Diagnosis Date  . Strep throat   . Chlamydia   . Infection     PARTNER + CHLAMYDIA  . Infection     FREQUENT YEAST  . Infection     BV    Past Surgical History  Procedure Laterality Date  . No past surgeries      Family History  Problem Relation Age of Onset  . Drug abuse Mother   . Drug abuse Father   . Diabetes Sister   . Down syndrome Maternal Uncle   . Cancer Paternal Uncle   . Diabetes Paternal Grandmother   . Cancer Paternal Grandfather     COLON  . Down syndrome Cousin     PATERNAL    History  Substance Use Topics  . Smoking status: Never Smoker   . Smokeless tobacco: Never Used  . Alcohol Use: Yes     Comment: RARELY    Allergies: No Known Allergies  Prescriptions prior to admission  Medication Sig Dispense Refill  . Prenatal Vit-Fe Fumarate-FA (PRENATAL PO) Take by mouth.      . metroNIDAZOLE (FLAGYL) 500 MG tablet Take 1 tablet (500 mg total) by mouth 2 (two) times daily.  14 tablet  0  . ondansetron (ZOFRAN) 8 MG tablet Take 1 tablet (8 mg total) by mouth every 8 (eight) hours as needed for nausea.  20 tablet  0  . promethazine (PHENERGAN) 25 MG tablet Take 1 tablet (25 mg total) by mouth every 6 (six) hours as  needed for nausea.  30 tablet  0    Physical Exam   Blood pressure 120/65, pulse 102, temperature 98.3 F (36.8 C), temperature source Oral, resp. rate 20, height 4\' 10"  (1.473 m), weight 109 lb 6 oz (49.612 kg), last menstrual period 01/11/2012, SpO2 100.00%.  Chest clear Heart RRR without murmur Abd gravid, soft, NT Pelvic--no blood in vault.  Cervix appears slightly friable around os, with cervix closed and long, NT. Ext WNL  Results for orders placed during the hospital encounter of 04/25/12 (from the past 24 hour(s))  RH IG WORKUP (INCLUDES ABO/RH)     Status: None   Collection Time    04/26/12  1:00 AM      Result Value Range   Gestational Age(Wks) 21.2     ABO/RH(D) B NEG     Antibody Screen POS     Antibody Identification PASSIVELY ACQUIRED ANTI-D     DAT, IgG NEG     Fetal Screen NEG     Unit Number [id number]     Blood Component Type RHIG     Unit division 00  Status of Unit ALLOCATED     Transfusion Status OK TO TRANSFUSE    WET PREP, GENITAL     Status: Abnormal   Collection Time    04/26/12  2:10 AM      Result Value Range   Yeast Wet Prep HPF POC NONE SEEN  NONE SEEN   Trich, Wet Prep NONE SEEN  NONE SEEN   Clue Cells Wet Prep HPF POC NONE SEEN  NONE SEEN   WBC, Wet Prep HPF POC FEW (*) NONE SEEN   GC, chlamydia pending.  [place]: IMPRESSION:  Single living intrauterine gestation with assigned gestational age  of [redacted] weeks 5 days. Appropriate growth since the prior ultrasound.  No evidence of retroplacental hemorrhage.  Cervix 3.1 cm.    ED Course  IUP at 21 2/7 weeks Spotting, likely due to friable cervix Antibody screen +, due to previous Rhophylac--no need for Rhophylac today  Plan: D/C home on bleeding precautions F/U as scheduled on 05/13/12 or prn.  Nigel Bridgeman CNM, [place] 04/26/2012 3:36 AM

## 2012-04-26 NOTE — MAU Note (Signed)
Pt. Came in because when she uses the bathroom she has pink tinged discharge when she wipes.

## 2012-04-27 ENCOUNTER — Encounter: Payer: Self-pay | Admitting: Obstetrics and Gynecology

## 2012-04-27 LAB — RH IG WORKUP (INCLUDES ABO/RH)
ABO/RH(D): B NEG
Gestational Age(Wks): 21.2
Unit division: 0

## 2012-04-27 LAB — GC/CHLAMYDIA PROBE AMP
CT Probe RNA: NEGATIVE
GC Probe RNA: NEGATIVE

## 2012-04-28 ENCOUNTER — Encounter: Payer: Self-pay | Admitting: Family Medicine

## 2012-04-28 ENCOUNTER — Telehealth: Payer: Self-pay

## 2012-04-28 ENCOUNTER — Ambulatory Visit: Payer: Medicaid Other | Admitting: Family Medicine

## 2012-04-28 ENCOUNTER — Telehealth: Payer: Self-pay | Admitting: Obstetrics and Gynecology

## 2012-04-28 VITALS — BP 102/60 | Wt 108.0 lb

## 2012-04-28 LAB — POCT URINALYSIS DIPSTICK
Bilirubin, UA: NEGATIVE
Glucose, UA: NEGATIVE
Ketones, UA: NEGATIVE
Spec Grav, UA: 1.01
Urobilinogen, UA: NEGATIVE
pH, UA: 7

## 2012-04-28 LAB — POCT WET PREP (WET MOUNT)
Bacteria Wet Prep HPF POC: NEGATIVE
Clue Cells Wet Prep Whiff POC: NEGATIVE
Trichomonas Wet Prep HPF POC: NEGATIVE
WBC, Wet Prep HPF POC: NEGATIVE
pH: 4.5

## 2012-04-28 MED ORDER — NITROFURANTOIN MONOHYD MACRO 100 MG PO CAPS: 100.0000 mg | ORAL_CAPSULE | Freq: Two times a day (BID) | ORAL | Status: AC

## 2012-04-28 NOTE — Patient Instructions (Signed)
Urinary Tract Infection Urinary tract infections (UTIs) can develop anywhere along your urinary tract. Your urinary tract is your body's drainage system for removing wastes and extra water. Your urinary tract includes two kidneys, two ureters, a bladder, and a urethra. Your kidneys are a pair of bean-shaped organs. Each kidney is about the size of your fist. They are located below your ribs, one on each side of your spine. CAUSES Infections are caused by microbes, which are microscopic organisms, including fungi, viruses, and bacteria. These organisms are so small that they can only be seen through a microscope. Bacteria are the microbes that most commonly cause UTIs. SYMPTOMS  Symptoms of UTIs may vary by age and gender of the patient and by the location of the infection. Symptoms in young women typically include a frequent and intense urge to urinate and a painful, burning feeling in the bladder or urethra during urination. Older women and men are more likely to be tired, shaky, and weak and have muscle aches and abdominal pain. A fever may mean the infection is in your kidneys. Other symptoms of a kidney infection include pain in your back or sides below the ribs, nausea, and vomiting. DIAGNOSIS To diagnose a UTI, your caregiver will ask you about your symptoms. Your caregiver also will ask to provide a urine sample. The urine sample will be tested for bacteria and white blood cells. White blood cells are made by your body to help fight infection. TREATMENT  Typically, UTIs can be treated with medication. Because most UTIs are caused by a bacterial infection, they usually can be treated with the use of antibiotics. The choice of antibiotic and length of treatment depend on your symptoms and the type of bacteria causing your infection. HOME CARE INSTRUCTIONS  If you were prescribed antibiotics, take them exactly as your caregiver instructs you. Finish the medication even if you feel better after you  have only taken some of the medication.  Drink enough water and fluids to keep your urine clear or pale yellow.  Avoid caffeine, tea, and carbonated beverages. They tend to irritate your bladder.  Empty your bladder often. Avoid holding urine for long periods of time.  Empty your bladder before and after sexual intercourse.  After a bowel movement, women should cleanse from front to back. Use each tissue only once. SEEK MEDICAL CARE IF:   You have back pain.  You develop a fever.  Your symptoms do not begin to resolve within 3 days. SEEK IMMEDIATE MEDICAL CARE IF:   You have severe back pain or lower abdominal pain.  You develop chills.  You have nausea or vomiting.  You have continued burning or discomfort with urination. MAKE SURE YOU:   Understand these instructions.  Will watch your condition.  Will get help right away if you are not doing well or get worse. Document Released: 11/26/2004 Document Revised: 08/18/2011 Document Reviewed: 03/27/2011 [employer] Patient Information 2013 [employer].  

## 2012-04-28 NOTE — Progress Notes (Signed)
[redacted]w[redacted]d Pt complains of pinkish discharge starting 4 days ago after intercourse. Seen at [facility], U/S done.

## 2012-04-28 NOTE — Telephone Encounter (Signed)
Pt was called and advised to pick up RX from pharmacy for UTI. Pt voice understanding. Mathis Bud

## 2012-04-28 NOTE — Progress Notes (Signed)
105w4d Patient reports continue VB since hospital 2/24 occurs intermittently, dime sized on toilet paper.  Denies vaginal itching, but dysuria and frequency with back pain. Denies fever or chills.  No intercourse since 2/24.  Denies contraction, but +FM and pressure. O:  EGUBS-unremarkable, parous cervix, that's friable at 12 and 6 o'clock, external os 1 cm-closed internally and thick. A: UA dipstick-  P: Pregnancy: ROB in 1 week for limited U/S for cervical length     Urinary Frequency: urine sent for culture.  Will treat empirically for UTI with macrobid 100 mg BID x 7 days. L.Cleota Pellerito, FNP-BC

## 2012-04-28 NOTE — Telephone Encounter (Signed)
Pt states that she was seen at [facility] on Monday for spotting. States that she was advised nothing was wrong and sent home. States that she is still having this today. States that the bleeding is only after urination and wiping. States that she does have a heaviness in her abdomen when using urinating. Not having any cramping. Slight back pain. GFM. States that she is drinking plenty of water.  Apt scheduled with LC, np today @ 1:30  Darien Ramus, CMA

## 2012-04-30 LAB — URINE CULTURE: Colony Count: >=100000

## 2012-05-05 ENCOUNTER — Ambulatory Visit: Payer: Medicaid Other

## 2012-05-05 ENCOUNTER — Ambulatory Visit: Payer: Medicaid Other | Admitting: Family Medicine

## 2012-05-05 VITALS — BP 76/48 | Wt 109.0 lb

## 2012-05-05 DIAGNOSIS — Z331 Pregnant state, incidental: Secondary | ICD-10-CM

## 2012-05-05 DIAGNOSIS — O3432 Maternal care for cervical incompetence, second trimester: Secondary | ICD-10-CM

## 2012-05-05 LAB — US OB TRANSVAGINAL

## 2012-05-05 NOTE — Progress Notes (Signed)
[redacted]w[redacted]d Doing well, good fm.  Denies bleeding. Back pain stopped. U/S discussed, no questions. No changes in cervical length. ROB in 1 week. L.Terran Hollenkamp, FNP-BC

## 2012-05-05 NOTE — Progress Notes (Signed)
Pt stated no issues today.  Ultrasound shows:  SIUP  S=D     [place] EDD: 09/09/2012           Cervical length: 3.65cm           Placenta localization: anterior           Fetal presentation: breech           Anatomy survey is normal            Comments: cx length Fluid is normal, vertical pocket =5.0 cm. Normal ovaries,no fluid in CDS, normal adenexas. Cervix is measured transvaginally(closed, no dynamic changes noted )

## 2012-05-13 ENCOUNTER — Encounter: Payer: Medicaid Other | Admitting: Certified Nurse Midwife

## 2012-05-16 ENCOUNTER — Ambulatory Visit: Payer: Medicaid Other | Admitting: Family Medicine

## 2012-05-16 VITALS — BP 90/60 | Wt 110.0 lb

## 2012-05-16 DIAGNOSIS — Z331 Pregnant state, incidental: Secondary | ICD-10-CM

## 2012-05-16 NOTE — Progress Notes (Signed)
[redacted]w[redacted]d Doing well, good fetal movement. Need Rhogam at 28 wks. No questions today. ROB in 2 weeks with glucola. L.Jody Aguinaga, FNP_BC

## 2012-05-16 NOTE — Progress Notes (Signed)
[redacted]w[redacted]d No complaints today.

## 2012-08-08 LAB — OB RESULTS CONSOLE GBS: GBS: POSITIVE

## 2012-08-23 ENCOUNTER — Inpatient Hospital Stay (HOSPITAL_COMMUNITY)
Admission: AD | Admit: 2012-08-23 | Discharge: 2012-08-23 | Disposition: A | Discharge status: Home / Self Care | Payer: Medicaid Other | Source: Ambulatory Visit | Attending: Obstetrics and Gynecology | Admitting: Obstetrics and Gynecology

## 2012-08-23 ENCOUNTER — Encounter (HOSPITAL_COMMUNITY): Payer: Self-pay | Admitting: *Deleted

## 2012-08-23 DIAGNOSIS — O99891 Other specified diseases and conditions complicating pregnancy: Secondary | ICD-10-CM | POA: Insufficient documentation

## 2012-08-23 DIAGNOSIS — Z22330 Carrier of Group B streptococcus: Secondary | ICD-10-CM | POA: Insufficient documentation

## 2012-08-23 DIAGNOSIS — O479 False labor, unspecified: Secondary | ICD-10-CM | POA: Insufficient documentation

## 2012-08-23 DIAGNOSIS — O99820 Streptococcus B carrier state complicating pregnancy: Secondary | ICD-10-CM

## 2012-08-23 NOTE — Discharge Summary (Signed)
Physician Discharge Summary  Patient ID: Shelley Mckinney MRN: [id number] DOB/AGE: 1990-12-02 21 y.o.  Admit date: 08/23/2012 Discharge date: 08/23/2012  Admission Diagnoses:  Early vs prodromal labor  Discharge Diagnoses:  Active Problems:   GBS (group B Streptococcus carrier), +RV culture, currently pregnant Prodromal labor  Discharged Condition: stable  Hospital Course:  22 yo G5P1031 at 42 2/7 weeks, presented for evaluation of possible labor--had been seen at [facility] earlier in the day for ROB visit, with cervix 3 cm, 90%, vtx, -1, and cervix examined. Reported contractions since, with small amount spotting.  Evaluated on Birthing Suite due to MAU census--NST reactive, irregular contractions noted.  Cervix 2-3 cm, 90%, vtx, -1, IBOW. Ambulated x 1 hour, with no cervical change or increase in contractions.  D/C'd home with labor precautions.    Consults: None--informed Dr. Stefano Gaul of patient status.  Significant Diagnostic Studies: None  Treatments: None  Discharge Exam: Blood pressure 126/79, pulse 84, temperature 98.2 F (36.8 C), temperature source Oral, resp. rate 18, height 4\' 8"  (1.422 m), weight 136 lb (61.689 kg), last menstrual period 01/11/2012, SpO2 100.00%. General appearance: alert Resp: clear to auscultation bilaterally Cardio: regular rate and rhythm, S1, S2 normal, no murmur, click, rub or gallop Pelvic: 2-3 cm, 90%, vtx, -1, IBOW Extremities: extremities normal, atraumatic, no cyanosis or edema  FHR reactive. UCs irregular in quality and pattern.  Disposition: 01-Home or Self Care     Medication List    TAKE these medications       PRENATAL PO  Take by mouth.           Follow-up Information   Follow up with [facility] On 08/31/2012. (Keep scheduled appt or call/return as needed.)    Contact information:   [address] [place] [address] [phone]      Signed: Nigel Bridgeman 08/23/2012,  6:26 PM

## 2012-08-23 NOTE — OB Triage Note (Signed)
Discharge instructions given and discussed with patient and patient family.  Patient and family verbalized understanding and all questions answered. Pt discharged home with family.

## 2012-08-23 NOTE — MAU Provider Note (Signed)
History   22 yo G5P1031 at 58 2/7 weeks presented unannounced c/o increased contractions and spotting--was at office at 11:20am today, seen by Dr. Estanislado Pandy, with cervix checked (?swept).  Was 3 cm, 90%, vtx, -1.  Denies leaking. Unsure of frequency of contractions--"hasn't been timing".  To Birthing Suite for triage due to MAU census.  Patient Active Problem List   Diagnosis Date Noted  . GBS (group B Streptococcus carrier), +RV culture, currently pregnant 08/23/2012  . Rh negative state in antepartum period 04/26/2012  . Spotting during pregnancy 04/26/2012  . Pregnant state, incidental 03/17/2012  . CANDIDIASIS OF VULVA AND VAGINA 05/28/2009  . ANEMIA 05/28/2009  . VAGINAL DISCHARGE 02/27/2009  Hx VE assisted vaginal delivery due to maternal fatigue after 1 1/2 hours of pushing.   Chief Complaint  Patient presents with  . Labor Eval     OB History   Grav Para Term Preterm Abortions TAB SAB Ect Mult Living   5 1 1  3 3    1       Past Medical History  Diagnosis Date  . Strep throat   . Chlamydia   . Infection     PARTNER + CHLAMYDIA  . Infection     FREQUENT YEAST  . Infection     BV    Past Surgical History  Procedure Laterality Date  . No past surgeries      Family History  Problem Relation Age of Onset  . Drug abuse Mother   . Drug abuse Father   . Diabetes Sister   . Down syndrome Maternal Uncle   . Cancer Paternal Uncle   . Diabetes Paternal Grandmother   . Cancer Paternal Grandfather     COLON  . Down syndrome Cousin     PATERNAL    History  Substance Use Topics  . Smoking status: Never Smoker   . Smokeless tobacco: Never Used  . Alcohol Use: Yes     Comment: RARELY    Allergies: No Known Allergies  Prescriptions prior to admission  Medication Sig Dispense Refill  . ondansetron (ZOFRAN) 8 MG tablet Take 1 tablet (8 mg total) by mouth every 8 (eight) hours as needed for nausea.  20 tablet  0  . Prenatal Vit-Fe Fumarate-FA (PRENATAL PO) Take  by mouth.         Physical Exam   Blood pressure 135/81, pulse 90, temperature 98.3 F (36.8 C), temperature source Oral, resp. rate 16, height 4\' 8"  (1.422 m), weight 136 lb (61.689 kg), last menstrual period 01/11/2012, SpO2 100.00%.  Chest clear Heart RRR without murmur Abd gravid, NT Pelvic--cervix 2-3 cm, 90%, vtx, -1, IBOW.  Small amount bloody show. Ext WNL  FHR Category 1 UCs  q 2-4 min, mild to palpation  ED Course  IUP at 38 2/7 weeks Early vs prodromal labor GBS positive  Plan: Observe x 2 hours, then re-evaluate. May ambulate.   Nigel Bridgeman CNM, [place] 08/23/2012 4:55 PM  Addendum: Observed x 1 hour with ambulation. No cervical change or increase in contractions. D/C'd home F/U as scheduled or as needed.  Nigel Bridgeman, CNM 08/23/12 6:30p

## 2012-08-23 NOTE — MAU Note (Signed)
Patient will be triaged in room 167 in [facility]. Report given to Yznaga, CN,

## 2012-08-23 NOTE — MAU Note (Signed)
Patient states she is having frequent contractions and bloody show. Was in the office at 1120 today and was 3 cm. Denies leaking. Reports good fetal movement.

## 2012-08-24 ENCOUNTER — Encounter (HOSPITAL_COMMUNITY): Payer: Self-pay | Admitting: *Deleted

## 2012-08-24 ENCOUNTER — Inpatient Hospital Stay (HOSPITAL_COMMUNITY): Payer: Medicaid Other | Admitting: Anesthesiology

## 2012-08-24 ENCOUNTER — Inpatient Hospital Stay (HOSPITAL_COMMUNITY)
Admission: AD | Admit: 2012-08-24 | Discharge: 2012-08-27 | DRG: 774 | Disposition: A | Discharge status: Home / Self Care | Payer: Medicaid Other | Source: Ambulatory Visit | Attending: Obstetrics and Gynecology | Admitting: Obstetrics and Gynecology

## 2012-08-24 ENCOUNTER — Encounter (HOSPITAL_COMMUNITY): Payer: Self-pay | Admitting: Anesthesiology

## 2012-08-24 DIAGNOSIS — O360131 Maternal care for anti-D [Rh] antibodies, third trimester, fetus 1: Secondary | ICD-10-CM

## 2012-08-24 DIAGNOSIS — O9903 Anemia complicating the puerperium: Secondary | ICD-10-CM | POA: Diagnosis not present

## 2012-08-24 DIAGNOSIS — IMO0002 Reserved for concepts with insufficient information to code with codable children: Secondary | ICD-10-CM | POA: Diagnosis not present

## 2012-08-24 DIAGNOSIS — O99892 Other specified diseases and conditions complicating childbirth: Secondary | ICD-10-CM | POA: Diagnosis present

## 2012-08-24 DIAGNOSIS — D649 Anemia, unspecified: Secondary | ICD-10-CM | POA: Diagnosis not present

## 2012-08-24 DIAGNOSIS — Z22330 Carrier of Group B streptococcus: Secondary | ICD-10-CM

## 2012-08-24 LAB — CBC
MCH: 26 pg (ref 26.0–34.0)
MCV: 77.9 fL — ABNORMAL LOW (ref 78.0–100.0)
Platelets: 147 10*3/uL — ABNORMAL LOW (ref 150–400)
RDW: 14 % (ref 11.5–15.5)
WBC: 9.4 10*3/uL (ref 4.0–10.5)

## 2012-08-24 LAB — RPR: RPR Ser Ql: NONREACTIVE

## 2012-08-24 MED ORDER — PENICILLIN G POTASSIUM 5000000 UNITS IJ SOLR
5.0000 10*6.[IU] | Freq: Once | INTRAVENOUS | Status: DC
  Administered 2012-08-24: 5 10*6.[IU] via INTRAVENOUS
  Filled 2012-08-24: qty 5

## 2012-08-24 MED ORDER — LACTATED RINGERS IV SOLN: 500.0000 mL | INTRAVENOUS | Status: DC | PRN

## 2012-08-24 MED ORDER — FENTANYL CITRATE 0.05 MG/ML IJ SOLN
100.0000 ug | INTRAMUSCULAR | Status: DC | PRN
  Administered 2012-08-24: 100 ug via INTRAVENOUS
  Filled 2012-08-24: qty 2

## 2012-08-24 MED ORDER — EPHEDRINE 5 MG/ML INJ
10.0000 mg | INTRAVENOUS | Status: DC | PRN
  Filled 2012-08-24: qty 2

## 2012-08-24 MED ORDER — DIPHENHYDRAMINE HCL 50 MG/ML IJ SOLN: 12.5000 mg | INTRAMUSCULAR | Status: DC | PRN

## 2012-08-24 MED ORDER — LIDOCAINE HCL (PF) 1 % IJ SOLN
30.0000 mL | INTRAMUSCULAR | Status: DC | PRN
  Filled 2012-08-24 (×2): qty 30

## 2012-08-24 MED ORDER — PENICILLIN G POTASSIUM 5000000 UNITS IJ SOLR
2.5000 10*6.[IU] | INTRAVENOUS | Status: DC
  Administered 2012-08-24 (×2): 2.5 10*6.[IU] via INTRAVENOUS
  Filled 2012-08-24 (×7): qty 2.5

## 2012-08-24 MED ORDER — PENICILLIN G POTASSIUM 5000000 UNITS IJ SOLR
5.0000 10*6.[IU] | Freq: Once | INTRAVENOUS | Status: AC
  Administered 2012-08-24: 5 10*6.[IU] via INTRAVENOUS

## 2012-08-24 MED ORDER — OXYCODONE-ACETAMINOPHEN 5-325 MG PO TABS: 1.0000 | ORAL_TABLET | ORAL | Status: DC | PRN

## 2012-08-24 MED ORDER — CITRIC ACID-SODIUM CITRATE 334-500 MG/5ML PO SOLN: 30.0000 mL | ORAL | Status: DC | PRN

## 2012-08-24 MED ORDER — LACTATED RINGERS IV SOLN
INTRAVENOUS | Status: DC
  Administered 2012-08-24 (×2): via INTRAVENOUS

## 2012-08-24 MED ORDER — OXYTOCIN BOLUS FROM INFUSION: 500.0000 mL | INTRAVENOUS | Status: DC

## 2012-08-24 MED ORDER — IBUPROFEN 600 MG PO TABS: 600.0000 mg | ORAL_TABLET | Freq: Four times a day (QID) | ORAL | Status: DC | PRN

## 2012-08-24 MED ORDER — OXYTOCIN 40 UNITS IN LACTATED RINGERS INFUSION - SIMPLE MED
62.5000 mL/h | INTRAVENOUS | Status: DC
  Administered 2012-08-24: 62.5 mL/h via INTRAVENOUS
  Filled 2012-08-24: qty 1000

## 2012-08-24 MED ORDER — ACETAMINOPHEN 500 MG PO TABS
1000.0000 mg | ORAL_TABLET | Freq: Once | ORAL | Status: AC
  Administered 2012-08-24: 1000 mg via ORAL
  Filled 2012-08-24: qty 2

## 2012-08-24 MED ORDER — LIDOCAINE HCL (PF) 1 % IJ SOLN: 30.0000 mL | INTRAMUSCULAR | Status: DC | PRN

## 2012-08-24 MED ORDER — ACETAMINOPHEN 325 MG PO TABS: 650.0000 mg | ORAL_TABLET | ORAL | Status: DC | PRN

## 2012-08-24 MED ORDER — ONDANSETRON HCL 4 MG/2ML IJ SOLN: 4.0000 mg | Freq: Four times a day (QID) | INTRAMUSCULAR | Status: DC | PRN

## 2012-08-24 MED ORDER — LACTATED RINGERS IV SOLN
500.0000 mL | INTRAVENOUS | Status: DC | PRN
  Administered 2012-08-24: 500 mL via INTRAVENOUS

## 2012-08-24 MED ORDER — PHENYLEPHRINE 40 MCG/ML (10ML) SYRINGE FOR IV PUSH (FOR BLOOD PRESSURE SUPPORT)
80.0000 ug | PREFILLED_SYRINGE | INTRAVENOUS | Status: DC | PRN
  Filled 2012-08-24: qty 2

## 2012-08-24 MED ORDER — OXYTOCIN 40 UNITS IN LACTATED RINGERS INFUSION - SIMPLE MED: 62.5000 mL/h | INTRAVENOUS | Status: DC

## 2012-08-24 MED ORDER — FENTANYL 2.5 MCG/ML BUPIVACAINE 1/10 % EPIDURAL INFUSION (WH - ANES)
14.0000 mL/h | INTRAMUSCULAR | Status: DC | PRN
  Administered 2012-08-24: 14 mL/h via EPIDURAL
  Filled 2012-08-24 (×2): qty 125

## 2012-08-24 MED ORDER — FENTANYL 2.5 MCG/ML BUPIVACAINE 1/10 % EPIDURAL INFUSION (WH - ANES)
INTRAMUSCULAR | Status: DC | PRN
  Administered 2012-08-24: 12 mL/h via EPIDURAL

## 2012-08-24 MED ORDER — LACTATED RINGERS IV SOLN: INTRAVENOUS | Status: DC

## 2012-08-24 MED ORDER — LIDOCAINE HCL (PF) 1 % IJ SOLN
INTRAMUSCULAR | Status: DC | PRN
  Administered 2012-08-24 (×2): 4 mL

## 2012-08-24 MED ORDER — TERBUTALINE SULFATE 1 MG/ML IJ SOLN: 0.2500 mg | Freq: Once | INTRAMUSCULAR | Status: AC | PRN

## 2012-08-24 MED ORDER — EPHEDRINE 5 MG/ML INJ
10.0000 mg | INTRAVENOUS | Status: DC | PRN
  Filled 2012-08-24: qty 2
  Filled 2012-08-24: qty 4

## 2012-08-24 MED ORDER — PHENYLEPHRINE 40 MCG/ML (10ML) SYRINGE FOR IV PUSH (FOR BLOOD PRESSURE SUPPORT)
80.0000 ug | PREFILLED_SYRINGE | INTRAVENOUS | Status: DC | PRN
  Filled 2012-08-24: qty 2
  Filled 2012-08-24: qty 5

## 2012-08-24 MED ORDER — PENICILLIN G POTASSIUM 5000000 UNITS IJ SOLR
2.5000 10*6.[IU] | INTRAVENOUS | Status: DC
  Filled 2012-08-24: qty 2.5

## 2012-08-24 MED ORDER — LACTATED RINGERS IV SOLN: 500.0000 mL | Freq: Once | INTRAVENOUS | Status: DC

## 2012-08-24 MED ORDER — ONDANSETRON HCL 4 MG/2ML IJ SOLN
4.0000 mg | Freq: Four times a day (QID) | INTRAMUSCULAR | Status: DC | PRN
  Administered 2012-08-24: 4 mg via INTRAVENOUS
  Filled 2012-08-24: qty 2

## 2012-08-24 NOTE — MAU Note (Signed)
Patient states she has been having contractions about every 3 minutes. States she had a little leaking this am but is not wearing a pad. Reports good fetal movement.

## 2012-08-24 NOTE — H&P (Signed)
Shelley Mckinney is a 22 y.o. female, [id number] at [redacted]w[redacted]d, presenting for SROM and UCs since yesterday but got worse through the night maybe around midnight.  Felt a gush this morning.   Denies VB, recent fever, resp or GI c/o's, UTI or PIH s/s. GFM. Desires epidural.  Patient Active Problem List   Diagnosis Date Noted  . GBS (group B Streptococcus carrier), +RV culture, currently pregnant 08/23/2012  . Rh negative state in antepartum period 04/26/2012  . Spotting during pregnancy 04/26/2012  . Pregnant state, incidental 03/17/2012  . CANDIDIASIS OF VULVA AND VAGINA 05/28/2009  . ANEMIA 05/28/2009  . VAGINAL DISCHARGE 02/27/2009    History of present pregnancy: Patient entered care at 10 weeks.   EDC of 09/04/12 was established by U/S at [redacted]w[redacted]d.   Anatomy scan:  19 weeks, with normal findings and an anterior placenta.   Additional [place] evaluations:  At [redacted]w[redacted]d for presentation - Vtx, AFI normal, anterior placenta.   Significant prenatal events:  none   Last evaluation:  08/23/12 at [redacted]w[redacted]d  OB History   Grav Para Term Preterm Abortions TAB SAB Ect Mult Living   5 1 1  3 3    1      Past Medical History  Diagnosis Date  . Strep throat   . Chlamydia   . Infection     PARTNER + CHLAMYDIA  . Infection     FREQUENT YEAST  . Infection     BV   Past Surgical History  Procedure Laterality Date  . Therapeutic abortion      x3   Family History: family history includes Cancer in her paternal grandfather and paternal uncle; Diabetes in her paternal grandmother and sister; Down syndrome in her cousin and maternal uncle; and Drug abuse in her father and mother. Social History:  reports that she has never smoked. She has never used smokeless tobacco. She reports that  drinks alcohol. She reports that she does not use illicit drugs.   Prenatal Transfer Tool  Maternal Diabetes: No Genetic Screening: Normal Maternal Ultrasounds/Referrals: Normal Fetal Ultrasounds or other Referrals:  None Maternal  Substance Abuse:  No Significant Maternal Medications:  None Significant Maternal Lab Results: Lab values include: Group B Strep positive    ROS: see HPI above, all other systems are negative  No Known Allergies   Dilation: 4 Effacement (%): 70 Station: -2 Exam by:: Shelley Mckinney Leaking fluid noted on perineum  Blood pressure 145/87, pulse 102, temperature 98.7 F (37.1 C), temperature source Oral, resp. rate 20, last menstrual period 01/11/2012.  Chest clear Heart RRR without murmur Abd gravid, NT Ext: WNL  FHR: Cat I UCs:  Q 2.5 to 5 min  Prenatal labs: ABO, Rh: --/--/B NEG (02/25 0100) Antibody: POS (02/25 0100) Rubella:   Immune RPR: NON REAC (12/18 1425)  HBsAg: NEGATIVE (12/18 1425)  HIV: NON REACTIVE (12/18 1425)  GBS: Positive (06/09 0000) Sickle cell/Hgb electrophoresis:  Hb-F is slightly increased, which is of no known physiologic significance. Pap:  03/17/12 WNL GC:  Neg Chlamydia:  Neg Genetic screenings:  AFP - neg for open spina bifida Glucola:  81 Other:  n/a  Assessment/Plan: IUP at [redacted]w[redacted]d SROM Active labor Desires epidural GBS pos  Admit to BS per consult with Dr Shelley Mckinney as attending MD Routine [facility] orders GBS prophylaxis - PCN per protocol Epidural prn  Shelley Blase, MSN 08/24/2012, 11:41 AM

## 2012-08-24 NOTE — Progress Notes (Signed)
Jennifer oxley cnm on unit aware that pt is 7 with bbow - feeling rectal pressure at times - had a 4 min decel after f/c placement - no new orders at present

## 2012-08-24 NOTE — Anesthesia Procedure Notes (Signed)
Epidural Patient location during procedure: OB Start time: 08/24/2012 1:43 PM  Staffing Anesthesiologist: Chiniqua Kilcrease A. Performed by: anesthesiologist   Preanesthetic Checklist Completed: patient identified, site marked, surgical consent, pre-op evaluation, timeout performed, IV checked, risks and benefits discussed and monitors and equipment checked  Epidural Patient position: sitting Prep: site prepped and draped and DuraPrep Patient monitoring: continuous pulse ox and blood pressure Approach: midline Injection technique: LOR air  Needle:  Needle type: Tuohy  Needle gauge: 17 G Needle length: 9 cm and 9 Needle insertion depth: 4 cm Catheter type: closed end flexible Catheter size: 19 Gauge Catheter at skin depth: 9 cm Test dose: negative and Other  Assessment Events: blood not aspirated, injection not painful, no injection resistance, negative IV test and no paresthesia  Additional Notes Patient identified. Risks and benefits discussed including failed block, incomplete  Pain control, post dural puncture headache, nerve damage, paralysis, blood pressure Changes, nausea, vomiting, reactions to medications-both toxic and allergic and post Partum back pain. All questions were answered. Patient expressed understanding and wished to proceed. Sterile technique was used throughout procedure. Epidural site was Dressed with sterile barrier dressing. No paresthesias, signs of intravascular injection Or signs of intrathecal spread were encountered.  Patient was more comfortable after the epidural was dosed. Please see RN's note for documentation of vital signs and FHR which are stable.

## 2012-08-24 NOTE — MAU Note (Signed)
Closer and stronger, now every 3 min.  Woke up at 0800- leaking clear fluid today.

## 2012-08-24 NOTE — Anesthesia Preprocedure Evaluation (Signed)
Anesthesia Evaluation  Patient identified by MRN, date of birth, ID band Patient awake    Reviewed: Allergy & Precautions, H&P , Patient's Chart, lab work & pertinent test results  Airway Mallampati: II TM Distance: >3 FB Neck ROM: full    Dental no notable dental hx. (+) Teeth Intact   Pulmonary neg pulmonary ROS,  breath sounds clear to auscultation  Pulmonary exam normal       Cardiovascular negative cardio ROS  Rhythm:regular Rate:Normal     Neuro/Psych negative neurological ROS  negative psych ROS   GI/Hepatic negative GI ROS, Neg liver ROS,   Endo/Other  negative endocrine ROS  Renal/GU negative Renal ROS  negative genitourinary   Musculoskeletal   Abdominal Normal abdominal exam  (+)   Peds  Hematology negative hematology ROS (+) anemia ,   Anesthesia Other Findings   Reproductive/Obstetrics (+) Pregnancy                           Anesthesia Physical Anesthesia Plan  ASA: II  Anesthesia Plan: Epidural   Post-op Pain Management:    Induction:   Airway Management Planned:   Additional Equipment:   Intra-op Plan:   Post-operative Plan:   Informed Consent: I have reviewed the patients History and Physical, chart, labs and discussed the procedure including the risks, benefits and alternatives for the proposed anesthesia with the patient or authorized representative who has indicated his/her understanding and acceptance.     Plan Discussed with: Anesthesiologist  Anesthesia Plan Comments:         Anesthesia Quick Evaluation

## 2012-08-24 NOTE — Progress Notes (Signed)
Spoke to j. oxley cnm - aware that pt is 5-6 with fifty cent size clots and bbow

## 2012-08-24 NOTE — Progress Notes (Signed)
Subjective: Comfortable. Labor shakes since epidural placed.  Intermittent vaginal pressure. RN asked for forebag to be ruptured.  Objective: BP 119/69  Pulse 87  Temp(Src) 98.6 F (37 C) (Axillary)  Resp 18  SpO2 100%  LMP 01/11/2012      FHT:  FHR: 145 bpm, variability: moderate,  accelerations:  Present,  decelerations:  Present intermittent variable/early UC:   irregular, every 1.5-6 minutes; some ctxs last for 3-4 min, uterus doesn't always return to complete rest SVE:  8.5/90/-1 forebag ruptured, large amt of clear fluid, and vtx came down to 0 station. Labs: Lab Results  Component Value Date   WBC 9.4 08/24/2012   HGB 11.4* 08/24/2012   HCT 34.2* 08/24/2012   MCV 77.9* 08/24/2012   PLT 147* 08/24/2012    Assessment / Plan: 1. [redacted]w[redacted]d 2. spont labor 3. GBS pos 4. Rh neg 5. short stature  Labor: Progressing normally Preeclampsia:  no signs or symptoms of toxicity Fetal Wellbeing:  Category II Pain Control:  Epidural I/D:  n/a Anticipated MOD:  NSVD 1. Will CTO for labor progression 2. C/w MD prn 3. CTO uterine tone closely and FHT  Shelley Mckinney 08/24/2012, 7:50 PM

## 2012-08-25 ENCOUNTER — Encounter (HOSPITAL_COMMUNITY): Payer: Self-pay | Admitting: *Deleted

## 2012-08-25 LAB — COMPREHENSIVE METABOLIC PANEL
BUN: 8 mg/dL (ref 6–23)
Calcium: 8.7 mg/dL (ref 8.4–10.5)
Creatinine, Ser: 0.71 mg/dL (ref 0.50–1.10)
GFR calc Af Amer: >90 mL/min (ref >90)
Glucose, Bld: 104 mg/dL — ABNORMAL HIGH (ref 70–99)
Total Protein: 5.3 g/dL — ABNORMAL LOW (ref 6.0–8.3)

## 2012-08-25 LAB — CBC
HCT: 28.8 % — ABNORMAL LOW (ref 36.0–46.0)
Hemoglobin: 9.6 g/dL — ABNORMAL LOW (ref 12.0–15.0)
MCH: 26.1 pg (ref 26.0–34.0)
MCHC: 33.3 g/dL (ref 30.0–36.0)

## 2012-08-25 LAB — PROTEIN / CREATININE RATIO, URINE
Creatinine, Urine: 52.04 mg/dL
Total Protein, Urine: 37.6 mg/dL

## 2012-08-25 LAB — LACTATE DEHYDROGENASE: LDH: 265 U/L — ABNORMAL HIGH (ref 94–250)

## 2012-08-25 MED ORDER — SENNOSIDES-DOCUSATE SODIUM 8.6-50 MG PO TABS
2.0000 | ORAL_TABLET | Freq: Every day | ORAL | Status: DC
  Administered 2012-08-25 – 2012-08-26 (×2): 2 via ORAL

## 2012-08-25 MED ORDER — METHYLERGONOVINE MALEATE 0.2 MG PO TABS: 0.2000 mg | ORAL_TABLET | ORAL | Status: DC | PRN

## 2012-08-25 MED ORDER — DIPHENHYDRAMINE HCL 25 MG PO CAPS: 25.0000 mg | ORAL_CAPSULE | Freq: Four times a day (QID) | ORAL | Status: DC | PRN

## 2012-08-25 MED ORDER — DIBUCAINE 1 % RE OINT
1.0000 "application " | TOPICAL_OINTMENT | RECTAL | Status: DC | PRN
  Filled 2012-08-25: qty 28

## 2012-08-25 MED ORDER — PRENATAL MULTIVITAMIN CH
1.0000 | ORAL_TABLET | Freq: Every day | ORAL | Status: DC
  Administered 2012-08-26 – 2012-08-27 (×2): 1 via ORAL
  Filled 2012-08-25 (×2): qty 1

## 2012-08-25 MED ORDER — ONDANSETRON HCL 4 MG PO TABS: 4.0000 mg | ORAL_TABLET | ORAL | Status: DC | PRN

## 2012-08-25 MED ORDER — IBUPROFEN 600 MG PO TABS
600.0000 mg | ORAL_TABLET | Freq: Four times a day (QID) | ORAL | Status: DC
  Administered 2012-08-25 – 2012-08-27 (×10): 600 mg via ORAL
  Filled 2012-08-25 (×10): qty 1

## 2012-08-25 MED ORDER — OXYCODONE-ACETAMINOPHEN 5-325 MG PO TABS
1.0000 | ORAL_TABLET | ORAL | Status: DC | PRN
  Administered 2012-08-25 – 2012-08-27 (×3): 1 via ORAL
  Filled 2012-08-25 (×3): qty 1

## 2012-08-25 MED ORDER — WITCH HAZEL-GLYCERIN EX PADS: 1.0000 "application " | MEDICATED_PAD | CUTANEOUS | Status: DC | PRN

## 2012-08-25 MED ORDER — MAGNESIUM HYDROXIDE 400 MG/5ML PO SUSP: 30.0000 mL | ORAL | Status: DC | PRN

## 2012-08-25 MED ORDER — SIMETHICONE 80 MG PO CHEW: 80.0000 mg | CHEWABLE_TABLET | ORAL | Status: DC | PRN

## 2012-08-25 MED ORDER — MEASLES, MUMPS & RUBELLA VAC ~~LOC~~ INJ
0.5000 mL | INJECTION | Freq: Once | SUBCUTANEOUS | Status: DC
  Filled 2012-08-25: qty 0.5

## 2012-08-25 MED ORDER — TETANUS-DIPHTH-ACELL PERTUSSIS 5-2.5-18.5 LF-MCG/0.5 IM SUSP: 0.5000 mL | Freq: Once | INTRAMUSCULAR | Status: DC

## 2012-08-25 MED ORDER — METHYLERGONOVINE MALEATE 0.2 MG/ML IJ SOLN: 0.2000 mg | INTRAMUSCULAR | Status: DC | PRN

## 2012-08-25 MED ORDER — LANOLIN HYDROUS EX OINT: TOPICAL_OINTMENT | CUTANEOUS | Status: DC | PRN

## 2012-08-25 MED ORDER — BENZOCAINE-MENTHOL 20-0.5 % EX AERO
1.0000 "application " | INHALATION_SPRAY | CUTANEOUS | Status: DC | PRN
  Filled 2012-08-25 (×2): qty 56

## 2012-08-25 MED ORDER — ZOLPIDEM TARTRATE 5 MG PO TABS: 5.0000 mg | ORAL_TABLET | Freq: Every evening | ORAL | Status: DC | PRN

## 2012-08-25 MED ORDER — ONDANSETRON HCL 4 MG/2ML IJ SOLN: 4.0000 mg | INTRAMUSCULAR | Status: DC | PRN

## 2012-08-25 NOTE — Progress Notes (Signed)
In room to give Motrin, mom in bathroom, asked support person to call me when she was through.

## 2012-08-25 NOTE — Progress Notes (Signed)
Mom in hallway on her way to NICU.  Will call me when she returns to take her Motrin.

## 2012-08-25 NOTE — Progress Notes (Signed)
Spoke to [employer] and reported blood pressure of 176/120.  Asked to have patient lay down, darken the room, turn off TV and re-check her blood pressure in an hour and report back the findings.  I spoke to the patient and let her know we would re-check her pressure in an hour and she was lying in bed with the lights and TV off.

## 2012-08-25 NOTE — Progress Notes (Signed)
Post Partum Day 1: S/P SVD with periurethral lac  Subjective: Patient up ad lib, denies syncope or dizziness. Feeding:  Baby in NICU for low sugar Contraceptive plan:   Nexplanon  Objective: Blood pressure 140/82, pulse 84, temperature 98.6 F (37 C), temperature source Oral, resp. rate 18, last menstrual period 01/11/2012, SpO2 96.00%, unknown if currently breastfeeding.  Physical Exam:  General: alert, cooperative and teary Lochia: appropriate Uterine Fundus: firm Incision: healing well DVT Evaluation: No evidence of DVT seen on physical exam. Negative Homan's sign.   Recent Labs  08/24/12 1205 08/25/12 0555  HGB 11.4* 9.6*  HCT 34.2* 28.8*    Assessment/Plan: S/P Vaginal delivery day 1 Continue current care Iron QD Plan for discharge tomorrow depending on baby's status   LOS: 1 day   Shelley Mckinney 08/25/2012, 8:41 AM

## 2012-08-25 NOTE — Progress Notes (Signed)
UR chart review completed.  

## 2012-08-25 NOTE — Progress Notes (Signed)
Baby is being transferred to NICU.  Mom crying and support person is present in the room. Denied any needs at this time.

## 2012-08-25 NOTE — Progress Notes (Signed)
Patient's blood pressure checked after NT reported to me that she had a 175/113.  I got 176/120.  Patient states no s/s at this time-she states she was crying prior to blood pressure being taken.

## 2012-08-25 NOTE — Lactation Note (Signed)
This note was copied from the chart of Shelley Mckinney. Lactation Consultation Note   Initial consult with this mom of a NICU baby. Her baby is term, and admitted to NICU for hypoglycemia. Mom breast fed baby prior to his admission to NICU. I set up a DEP and got mom pumping in premie setting. Hand expression taught, and mom expressed a few small drops of colostrum. I advised mom to bring it to her baby, and feed it to him by either finger feeding, or prior to breast feeding. Mom was taught about providing BM otoher baby by reviewed tin NICU booklet. Mom advised to breast feed the baby in the NICU, and to pump and hand express every 3 hours. I will follow this family in the NICU.  Patient Name: Shelley Mckinney [id number] Date: 08/25/2012 Reason for consult: Initial assessment;NICU baby   Maternal Data Formula Feeding for Exclusion: Yes (baby is in NICU) Infant to breast within first hour of birth: Yes Has patient been taught Hand Expression?: Yes Does the patient have breastfeeding experience prior to this delivery?: Yes  Feeding    LATCH Score/Interventions                      Lactation Tools Discussed/Used Tools: Pump Breast pump type: Double-Electric Breast Pump WIC Program: Yes (mom knows to call to add baby, and get DEP) Pump Review: Setup, frequency, and cleaning;Milk Storage;Other (comment) (premie setting, hand expression, teachin on providing BM for) Initiated by:: c Rumaysa Sabatino - I set pump up at 10 am, and told mom to call me when she got back to her room. she never did, so I wnet back at 1500 and got her started  Date initiated:: 08/25/12 (1500, within 5 hours of baby going to NICU)   Consult Status Consult Status: Follow-up Date: 08/26/12 Follow-up type: In-patient    Alfred Levins 08/25/2012, 4:10 PM

## 2012-08-25 NOTE — Anesthesia Postprocedure Evaluation (Signed)
  Anesthesia Post-op Note  Patient: Shelley Mckinney  Procedure(s) Performed: * No procedures listed *  Patient Location: Mother/Baby  Anesthesia Type:Epidural  Level of Consciousness: awake, alert , oriented and patient cooperative  Airway and Oxygen Therapy: Patient Spontanous Breathing  Post-op Pain: mild  Post-op Assessment: Patient's Cardiovascular Status Stable, Respiratory Function Stable, Patent Airway, No signs of Nausea or vomiting, Adequate PO intake and Pain level controlled  Post-op Vital Signs: Reviewed and stable  Complications: No apparent anesthesia complications

## 2012-08-26 LAB — COMPREHENSIVE METABOLIC PANEL
Albumin: 2 g/dL — ABNORMAL LOW (ref 3.5–5.2)
Alkaline Phosphatase: 120 U/L — ABNORMAL HIGH (ref 39–117)
BUN: 10 mg/dL (ref 6–23)
Calcium: 8.9 mg/dL (ref 8.4–10.5)
Creatinine, Ser: 0.56 mg/dL (ref 0.50–1.10)
GFR calc Af Amer: >90 mL/min (ref >90)
Glucose, Bld: 99 mg/dL (ref 70–99)
Potassium: 4.2 mEq/L (ref 3.5–5.1)
Total Protein: 5.5 g/dL — ABNORMAL LOW (ref 6.0–8.3)

## 2012-08-26 LAB — CBC
HCT: 28 % — ABNORMAL LOW (ref 36.0–46.0)
Hemoglobin: 9.2 g/dL — ABNORMAL LOW (ref 12.0–15.0)
MCH: 25.8 pg — ABNORMAL LOW (ref 26.0–34.0)
MCHC: 32.9 g/dL (ref 30.0–36.0)
MCV: 78.4 fL (ref 78.0–100.0)
RDW: 14.3 % (ref 11.5–15.5)

## 2012-08-26 LAB — URIC ACID: Uric Acid, Serum: 5.7 mg/dL (ref 2.4–7.0)

## 2012-08-26 LAB — LACTATE DEHYDROGENASE: LDH: 193 U/L (ref 94–250)

## 2012-08-26 LAB — PROTEIN / CREATININE RATIO, URINE: Protein Creatinine Ratio: 0.8 — ABNORMAL HIGH (ref 0.00–0.15)

## 2012-08-26 MED ORDER — MAGNESIUM SULFATE 40 G IN LACTATED RINGERS - SIMPLE
2.0000 g/h | INTRAVENOUS | Status: AC
  Filled 2012-08-26: qty 500

## 2012-08-26 MED ORDER — MAGNESIUM SULFATE 40 G IN LACTATED RINGERS - SIMPLE
2.0000 g/h | Freq: Once | INTRAVENOUS | Status: AC
  Administered 2012-08-26: 2 g/h via INTRAVENOUS
  Filled 2012-08-26: qty 500

## 2012-08-26 MED ORDER — NIFEDIPINE ER 30 MG PO TB24
30.0000 mg | ORAL_TABLET | Freq: Once | ORAL | Status: AC
  Administered 2012-08-26: 30 mg via ORAL
  Filled 2012-08-26: qty 1

## 2012-08-26 MED ORDER — MAGNESIUM SULFATE BOLUS VIA INFUSION
4.0000 g | Freq: Once | INTRAVENOUS | Status: AC
  Administered 2012-08-26: 4 g via INTRAVENOUS
  Filled 2012-08-26: qty 500

## 2012-08-26 MED ORDER — MAGNESIUM SULFATE 40 MG/ML IJ SOLN
4.0000 g | Freq: Once | INTRAMUSCULAR | Status: DC
  Filled 2012-08-26: qty 100

## 2012-08-26 MED ORDER — LACTATED RINGERS IV SOLN
INTRAVENOUS | Status: DC
  Administered 2012-08-26: 100 mL/h via INTRAVENOUS
  Administered 2012-08-26 – 2012-08-27 (×2): via INTRAVENOUS

## 2012-08-26 NOTE — Progress Notes (Signed)
OOB to BR to void.  Pericare performed.  Pt. Tolerated activity well.

## 2012-08-26 NOTE — Progress Notes (Signed)
CSW attempted to meet with MOB this morning to complete assessment due to NICU admission but her room was empty and she appeared to have been discharged.  CSW later learned that MOB has been transferred to Antenatal and put on magnesium due to elevated BP.  CSW will attempt again to meet with MOB at a later time if possible.   

## 2012-08-26 NOTE — Progress Notes (Signed)
I visited with Gwendelyn and FOB, Trayvon, while I was rounding on the unit.    They were quiet but seemed to be coping well with the situation.  Although both of them have children from previous relationships, they have never had an experience like this one with their child in the NICU.  They have good family support and did not report any particular needs at this time.  [address] [place] Pager, [phone] 11:34 AM   08/26/12 1100  Clinical Encounter Type  Visited With Patient and family together  Visit Type Initial

## 2012-08-26 NOTE — Progress Notes (Signed)
Using breast pump

## 2012-08-26 NOTE — Progress Notes (Signed)
Patient has had elevated BPs overnight. Contacted CNM around midnight about BP's. Several labs ordered along with Procardia XL. 0200 BP 167/101-patient was asleep on right side before BP. Lab results reported to CNM-she will alert oncoming staff. No further orders at this time

## 2012-08-26 NOTE — Progress Notes (Signed)
Report given to RN in Antenatal about patient transfer. About to start IV then transport

## 2012-08-26 NOTE — Progress Notes (Signed)
Mag. Sulfate infusion going well into right hand IV site.  No s/s of infiltration at site.

## 2012-08-26 NOTE — Progress Notes (Signed)
Patient ID: Shelley Mckinney, female   DOB: 1990-04-02, 22 y.o.   MRN: [id number]  S: pt states she had a mild headache and rcv'd some motrin earlier and it is relieved now, denies any other complaints, no N/V/RUQ pain or dizziness Baby is in NICU for hypoglycemia,   O:  Filed Vitals:   08/25/12 2344 08/26/12 0043 08/26/12 0204 08/26/12 0639  BP: 164/94 159/94 167/101 122/66  Pulse:   76 128  Temp:    98.2 F (36.8 C)  TempSrc:    Oral  Resp:    17  SpO2:    100%   Results for orders placed during the hospital encounter of 08/24/12 (from the past 24 hour(s))  PROTEIN / CREATININE RATIO, URINE     Status: Abnormal   Collection Time    08/25/12 11:15 AM      Result Value Range   Creatinine, Urine 52.04     Total Protein, Urine 37.6     PROTEIN CREATININE RATIO 0.72 (*) 0.00 - 0.15  CBC     Status: Abnormal   Collection Time    08/26/12  1:05 AM      Result Value Range   WBC 10.7 (*) 4.0 - 10.5 K/uL   RBC 3.57 (*) 3.87 - 5.11 MIL/uL   Hemoglobin 9.2 (*) 12.0 - 15.0 g/dL   HCT 84.1 (*) 32.4 - 40.1 %   MCV 78.4  78.0 - 100.0 fL   MCH 25.8 (*) 26.0 - 34.0 pg   MCHC 32.9  30.0 - 36.0 g/dL   RDW 02.7  25.3 - 66.4 %   Platelets 127 (*) 150 - 400 K/uL  COMPREHENSIVE METABOLIC PANEL     Status: Abnormal   Collection Time    08/26/12  1:05 AM      Result Value Range   Sodium 136  135 - 145 mEq/L   Potassium 4.2  3.5 - 5.1 mEq/L   Chloride 103  96 - 112 mEq/L   CO2 25  19 - 32 mEq/L   Glucose, Bld 99  70 - 99 mg/dL   BUN 10  6 - 23 mg/dL   Creatinine, Ser 4.03  0.50 - 1.10 mg/dL   Calcium 8.9  8.4 - 47.4 mg/dL   Total Protein 5.5 (*) 6.0 - 8.3 g/dL   Albumin 2.0 (*) 3.5 - 5.2 g/dL   AST 17  0 - 37 U/L   ALT 11  0 - 35 U/L   Alkaline Phosphatase 120 (*) 39 - 117 U/L   Total Bilirubin 0.1 (*) 0.3 - 1.2 mg/dL   GFR calc non Af Amer >90  >90 mL/min   GFR calc Af Amer >90  >90 mL/min  LACTATE DEHYDROGENASE     Status: None   Collection Time    08/26/12  1:05 AM      Result Value  Range   LDH 193  94 - 250 U/L  URIC ACID     Status: None   Collection Time    08/26/12  1:05 AM      Result Value Range   Uric Acid, Serum 5.7  2.4 - 7.0 mg/dL  PROTEIN / CREATININE RATIO, URINE     Status: Abnormal   Collection Time    08/26/12  2:15 AM      Result Value Range   Creatinine, Urine 27.54     Total Protein, Urine 21.9     PROTEIN CREATININE RATIO 0.80 (*) 0.00 - 0.15  A: PPD2  Protein/creatinine ratio .8 pre-eclampsia   P: transfer to AICU Begin Magnesium sulfate for seizure prophylaxis - 4g bolus then 2g/hour for 24 hours Strict I/O Daily weights rv'd w Pt rationale and pt agrees D/w Dr Su Hilt CTO BP's closely  S.Sadarius Norman, CNM

## 2012-08-26 NOTE — Progress Notes (Signed)
Instructed in use of Medella breastpump.  Encouraged to use breastpump q3 hours everyday.  Pt. Verbalized understanding of instructions and agreed to comply.

## 2012-08-26 NOTE — Progress Notes (Signed)
Post Partum Day 2 Subjective: no complaints, up ad lib and tolerating PO.  Patient seen at approximately 9 AM  Objective: Blood pressure 132/89, pulse 95, temperature 98.3 F (36.8 C), temperature source Oral, resp. rate 20, height 4\' 11"  (1.499 m), weight 126 lb (57.153 kg), last menstrual period 01/11/2012, SpO2 99.00%, unknown if currently breastfeeding.  Physical Exam:  General: alert, cooperative, appears stated age and no distress Lochia: appropriate Uterine Fundus: firm Incision: Not applicable    Recent Labs  08/25/12 0555 08/26/12 0105  HGB 9.6* 9.2*  HCT 28.8* 28.0*    Assessment/Plan: Preeclampsia Improved. Blood pressure control. Status post beginning magnesium sulfate Review the plan of care with the patient and father of the baby. Will continue magnesium for a total of 24-hour and then discontinue. If blood pressures remain stable. Patient may be discharged home after 4 hours off magnesium sulfate. Will plan, home health nurse. Blood pressure check approximately 48 hours after discharge   LOS: 2 days   Shelley Mckinney P 08/26/2012, 6:29 PM

## 2012-08-26 NOTE — Progress Notes (Signed)
Denies c/o SOB. HOB elevated 20 degrees.

## 2012-08-26 NOTE — Progress Notes (Signed)
Patient ID: Theotis Barrio, female   DOB: May 24, 1990, 22 y.o.   MRN: [id number]  S: Called to BS by RN due to pt's BP elevated.  O: Pt resting quietly in bed on her r side, lights off, denies HA/N/N/VRUQ pain Baby in NICU for glucose control   A: PPD2, infant in NICU for hypoglycemia Previous PIH labs were normal,  Results for orders placed during the hospital encounter of 08/24/12 (from the past 24 hour(s))  CBC     Status: Abnormal   Collection Time    08/25/12  5:55 AM      Result Value Range   WBC 16.1 (*) 4.0 - 10.5 K/uL   RBC 3.68 (*) 3.87 - 5.11 MIL/uL   Hemoglobin 9.6 (*) 12.0 - 15.0 g/dL   HCT 40.9 (*) 81.1 - 91.4 %   MCV 78.3  78.0 - 100.0 fL   MCH 26.1  26.0 - 34.0 pg   MCHC 33.3  30.0 - 36.0 g/dL   RDW 78.2  95.6 - 21.3 %   Platelets 120 (*) 150 - 400 K/uL  COMPREHENSIVE METABOLIC PANEL     Status: Abnormal   Collection Time    08/25/12  5:55 AM      Result Value Range   Sodium 135  135 - 145 mEq/L   Potassium 4.5  3.5 - 5.1 mEq/L   Chloride 105  96 - 112 mEq/L   CO2 24  19 - 32 mEq/L   Glucose, Bld 104 (*) 70 - 99 mg/dL   BUN 8  6 - 23 mg/dL   Creatinine, Ser 0.86  0.50 - 1.10 mg/dL   Calcium 8.7  8.4 - 57.8 mg/dL   Total Protein 5.3 (*) 6.0 - 8.3 g/dL   Albumin 1.9 (*) 3.5 - 5.2 g/dL   AST 22  0 - 37 U/L   ALT 12  0 - 35 U/L   Alkaline Phosphatase 134 (*) 39 - 117 U/L   Total Bilirubin 0.4  0.3 - 1.2 mg/dL   GFR calc non Af Amer >90  >90 mL/min   GFR calc Af Amer >90  >90 mL/min  LACTATE DEHYDROGENASE     Status: Abnormal   Collection Time    08/25/12  5:55 AM      Result Value Range   LDH 265 (*) 94 - 250 U/L  URIC ACID     Status: None   Collection Time    08/25/12  5:55 AM      Result Value Range   Uric Acid, Serum 6.0  2.4 - 7.0 mg/dL  RH IG WORKUP (INCLUDES ABO/RH)     Status: None   Collection Time    08/25/12  5:55 AM      Result Value Range   Gestational Age(Wks) 38.3     ABO/RH(D) B NEG     Antibody Identification PASSIVELY ACQUIRED  ANTI-D     Antibody Screen POS     DAT, IgG NEG     Fetal Screen NEG     Unit Number [id number]     Blood Component Type RHIG     Unit division 00     Status of Unit ALLOCATED     Transfusion Status OK TO TRANSFUSE    PROTEIN / CREATININE RATIO, URINE     Status: Abnormal   Collection Time    08/25/12 11:15 AM      Result Value Range   Creatinine, Urine 52.04     Total  Protein, Urine 37.6     PROTEIN CREATININE RATIO 0.72 (*) 0.00 - 0.15    Filed Vitals:   08/25/12 2244 08/25/12 2249 08/25/12 2339 08/25/12 2344  BP: 162/94 162/80 170/106 164/94  Pulse:   77   Temp:      TempSrc:      Resp:      SpO2:       Per RN manual BP was 162/94   P: worsening BP's with gestational hypertension vs. pre-eclampsia   D/w Dr Su Hilt Will re-check [facility] labs and prot/creat ratio now Will give procardia XL 30mg    S.Glena Pharris, CNM

## 2012-08-27 DIAGNOSIS — IMO0002 Reserved for concepts with insufficient information to code with codable children: Secondary | ICD-10-CM | POA: Diagnosis present

## 2012-08-27 LAB — CBC
Hemoglobin: 9.5 g/dL — ABNORMAL LOW (ref 12.0–15.0)
MCH: 26.7 pg (ref 26.0–34.0)
Platelets: 158 10*3/uL (ref 150–400)
RBC: 3.56 MIL/uL — ABNORMAL LOW (ref 3.87–5.11)

## 2012-08-27 MED ORDER — IBUPROFEN 600 MG PO TABS: 600.0000 mg | ORAL_TABLET | Freq: Four times a day (QID) | ORAL | Status: DC

## 2012-08-27 MED ORDER — NIFEDIPINE ER 30 MG PO TB24
30.0000 mg | ORAL_TABLET | Freq: Every day | ORAL | Status: DC
  Administered 2012-08-27: 30 mg via ORAL
  Filled 2012-08-27 (×2): qty 1

## 2012-08-27 MED ORDER — OXYCODONE-ACETAMINOPHEN 5-325 MG PO TABS: 1.0000 | ORAL_TABLET | ORAL | Status: DC | PRN

## 2012-08-27 MED ORDER — NIFEDIPINE ER 30 MG PO TB24: 30.0000 mg | ORAL_TABLET | Freq: Every day | ORAL | Status: DC

## 2012-08-27 MED ORDER — RHO D IMMUNE GLOBULIN 1500 UNIT/2ML IJ SOLN
300.0000 ug | Freq: Once | INTRAMUSCULAR | Status: AC
  Administered 2012-08-27: 300 ug via INTRAMUSCULAR
  Filled 2012-08-27: qty 2

## 2012-08-27 MED ORDER — FUSION PLUS PO CAPS: 1.0000 | ORAL_CAPSULE | Freq: Two times a day (BID) | ORAL | Status: DC

## 2012-08-27 NOTE — Discharge Summary (Signed)
   Obstetric Discharge Summary Reason for Admission: onset of labor Prenatal Procedures: none Intrapartum Procedures: spontaneous vaginal delivery Postpartum Procedures: Magnesium sulfate Complications-Operative and Postpartum: Postpartum preeclampsia  Temp:  [98 F (36.7 C)-98.4 F (36.9 C)] 98 F (36.7 C) (06/28 1244) Pulse Rate:  [81-102] 102 (06/28 1421) Resp:  [18-20] 18 (06/28 1421) BP: (122-149)/(74-90) 129/74 mmHg (06/28 1421) SpO2:  [98 %-100 %] 100 % (06/27 1900) Hemoglobin  Date Value Range Status  08/27/2012 9.5* 12.0 - 15.0 g/dL Final     HCT  Date Value Range Status  08/27/2012 28.0* 36.0 - 46.0 % Final    Hospital Course:  Hospital Course: Admitted in labor . Positive GBS. Progressed to fully dilated. Delivery was performed by the ovaries still in without difficulty. Patient and baby tolerated the procedure without difficulty, with a periurethral laceration noted. Infant to FTN. Mother and infant then had a postpartum course complicated by postpartum preeclampsia for which he was treated with 24 hours of magnesium sulfate. Breast and bottle feeding were supported with the infant in the neonatal intensive carel. Mom's physical exam was WNL, and she was discharged home in stable condition. Contraception plan was Nexplanon to be done at 6 wks.  She received adequate benefit from po pain medications.  Discharge Diagnoses: Term Pregnancy-delivered, Preelampsia and Anemia  Discharge Information: Date: 08/27/2012 Activity: pelvic rest Diet: routine Medications:    Medication List         FUSION PLUS Caps  Take 1 tablet by mouth 2 (two) times daily.     ibuprofen 600 MG tablet  Commonly known as:  ADVIL,MOTRIN  Take 1 tablet (600 mg total) by mouth every 6 (six) hours.     NIFEdipine 30 MG 24 hr tablet  Commonly known as:  PROCARDIA-XL/ADALAT CC  Take 1 tablet (30 mg total) by mouth daily.     oxyCODONE-acetaminophen 5-325 MG per tablet  Commonly known as:   PERCOCET/ROXICET  Take 1 tablet by mouth every 4 (four) hours as needed.     PRENATAL PO  Take by mouth.       Condition: improved Instructions: refer to practice specific booklet Discharge to: home     Follow-up Information   Follow up with [facility] In 6 weeks. [facility] nurse to see patient for blood pressure on Monday, June 30)    Contact information:   [address] [place] [address]       Newborn Data: Live born  Information for the patient's newborn:  Aliciana, Ricciardi [[id number]]  female ; [id number], ; weight ; 6 lbs 11 oz Her prescriptions Infant remains in the neonatal intensive care unit.  Ta Fair P 08/27/2012, 3:24 PM

## 2012-08-27 NOTE — Progress Notes (Signed)
Pt discharges with rx and discharge instructions.  Pt verlbalizes understanding, pt has her manual breast pump provided by the hospital.  Pt has no questions at this time.  [facility] was left a message to see pt Monday for a BP check and pt understands that iod she doesn't hear from them she is to call Dr. Charleen Kirks office and f/u with them.

## 2012-08-27 NOTE — Lactation Note (Signed)
This note was copied from the chart of Shelley Mckinney. Lactation Consultation Note  Patient Name: Shelley Mckinney [id number] Date: 08/27/2012 Reason for consult: Follow-up assessment   Maternal Data    Feeding    LATCH Score/Interventions          Comfort (Breast/Nipple): Filling, red/small blisters or bruises, mild/mod discomfort  Problem noted: Filling        Lactation Tools Discussed/Used     Consult Status Consult Status: Complete  Called to Antenatal because mom who has been on MgSO4 is being DC'd. Baby in NICU. Mom has pumped once today and once yesterday. Is not putting baby to the breast in NICU. Reports that breasts are feeling fuller this morning. Mom has [facility]- offered [facility] loaner but mom states she does not have $30 for deposit. Reviewed engorgement prevention and treatment. Showed her manual pump from her pump pieces so she would have something for use at home. Can use pump in NICU when she is visiting baby. No questions at present. To call prn  Pamelia Hoit 08/27/2012, 3:18 PM

## 2012-08-28 ENCOUNTER — Encounter (HOSPITAL_COMMUNITY): Payer: Self-pay | Admitting: *Deleted

## 2012-08-28 ENCOUNTER — Inpatient Hospital Stay (HOSPITAL_COMMUNITY)
Admission: AD | Admit: 2012-08-28 | Discharge: 2012-08-28 | Disposition: A | Discharge status: Home / Self Care | Payer: Medicaid Other | Source: Ambulatory Visit | Attending: Obstetrics and Gynecology | Admitting: Obstetrics and Gynecology

## 2012-08-28 DIAGNOSIS — O9081 Anemia of the puerperium: Secondary | ICD-10-CM | POA: Insufficient documentation

## 2012-08-28 DIAGNOSIS — D649 Anemia, unspecified: Secondary | ICD-10-CM | POA: Insufficient documentation

## 2012-08-28 LAB — CBC
HCT: 31 % — ABNORMAL LOW (ref 36.0–46.0)
MCV: 78.9 fL (ref 78.0–100.0)
Platelets: 175 10*3/uL (ref 150–400)
RBC: 3.93 MIL/uL (ref 3.87–5.11)
RDW: 14.1 % (ref 11.5–15.5)
WBC: 8.3 10*3/uL (ref 4.0–10.5)

## 2012-08-28 LAB — RH IG WORKUP (INCLUDES ABO/RH)
ABO/RH(D): B NEG
Gestational Age(Wks): 38.3
Unit division: 0

## 2012-08-28 NOTE — MAU Provider Note (Signed)
History   Shelley Mckinney is a 22y.o. Petite BF who presents 4 days PP s/p SVD on 08/24/12, unannounced w/ Cc of passing a "large clot."  On inquiry, she reports clot was less than the size of an egg.  Denies heavy bleeding associated w/ passage or since.  Accompanied by her s.o., [name].   Pt denies fever, chills, resp or GI c/o's.  Has been having occ'l Headache the last few days.  No other PIH s/s.  No significant abdominal pain.  She was d/c'd yesterday on po Procardia XL 30mg  qd and received Mag sulfate 08/26/12 for 24 hrs for PreEclampsia dx'd post delivery. Her newborn was taken to NICU w/in a few hr of birth for hypoglycemia, and remains there, but no longer has an IV.  Pt had pumped occasionally, but reports she is probably going to formula feed.  No difficulty voiding.  No dizziness when up.  Positive BM's since delivery.   .. Patient Active Problem List   Diagnosis Date Noted  . Mild or unspecified pre-eclampsia, with delivery 08/27/2012  . Laceration of periurethral tissue with delivery, delivered 08/24/2012  . GBS (group B Streptococcus carrier), +RV culture, currently pregnant 08/23/2012  . Spotting during pregnancy 04/26/2012  . Pregnant state, incidental 03/17/2012  . CANDIDIASIS OF VULVA AND VAGINA 05/28/2009  . ANEMIA 05/28/2009  . VAGINAL DISCHARGE 02/27/2009   CSN: [id number]  Arrival date and time: 08/28/12 1421   None     Chief Complaint  Patient presents with  . Vaginal Bleeding   HPI  OB History   Grav Para Term Preterm Abortions TAB SAB Ect Mult Living   5 2 2  3 3    2       Past Medical History  Diagnosis Date  . Strep throat   . Chlamydia   . Infection     PARTNER + CHLAMYDIA  . Infection     FREQUENT YEAST  . Infection     BV  . NSVD (normal spontaneous vaginal delivery) 08/24/2012    2311    Past Surgical History  Procedure Laterality Date  . Therapeutic abortion      x3    Family History  Problem Relation Age of Onset  . Drug abuse  Mother   . Drug abuse Father   . Diabetes Sister   . Down syndrome Maternal Uncle   . Cancer Paternal Uncle   . Diabetes Paternal Grandmother   . Cancer Paternal Grandfather     COLON  . Down syndrome Cousin     PATERNAL    History  Substance Use Topics  . Smoking status: Never Smoker   . Smokeless tobacco: Never Used  . Alcohol Use: Yes     Comment: RARELY    Allergies: No Known Allergies  Prescriptions prior to admission  Medication Sig Dispense Refill  . ibuprofen (ADVIL,MOTRIN) 600 MG tablet Take 1 tablet (600 mg total) by mouth every 6 (six) hours.  60 tablet  0  . NIFEdipine (PROCARDIA-XL/ADALAT CC) 30 MG 24 hr tablet Take 1 tablet (30 mg total) by mouth daily.  60 tablet  2  . Prenatal Vit-Fe Fumarate-FA (PRENATAL MULTIVITAMIN) TABS Take 1 tablet by mouth daily at 12 noon.      . Iron-FA-B Cmp-C-Biot-Probiotic (FUSION PLUS) CAPS Take 1 tablet by mouth 2 (two) times daily.  60 capsule  12  . oxyCODONE-acetaminophen (PERCOCET/ROXICET) 5-325 MG per tablet Take 1 tablet by mouth every 4 (four) hours as needed.  15 tablet  0    ROS--see HPI Physical Exam   Blood pressure 133/80, pulse 98, resp. rate 18, height 4\' 11"  (1.499 m), weight 123 lb 9.6 oz (56.065 kg), unknown if currently breastfeeding. .. Filed Vitals:   08/28/12 1430 08/28/12 1529  BP: 143/91 133/80  Pulse: 106 98  TempSrc: Oral   Resp: 18   Height: 4\' 11"  (1.499 m)   Weight: 123 lb 9.6 oz (56.065 kg)    Physical Exam  Constitutional: She is oriented to person, place, and time. She appears well-developed and well-nourished. No distress.  HENT:  Head: Normocephalic and atraumatic.  Eyes: Pupils are equal, round, and reactive to light.  Cardiovascular: Normal rate.   Respiratory: Effort normal.  GI: Soft.  Soft, NT, FF, below umbilicus  Genitourinary:  SSE: no active bleeding; no blood on pad, which she had been on for about 2 hrs in MAU (while I was in a delivery) Small amt of mucousy blood in  vault that barely superficially lined a fox swab; slight musty odor. Deferred bimanual  Musculoskeletal: She exhibits no edema.  Neurological: She is alert and oriented to person, place, and time.  Skin: Skin is warm and dry.  Psychiatric: She has a normal mood and affect. Her behavior is normal. Judgment and thought content normal.   .. Results for orders placed during the hospital encounter of 08/28/12 (from the past 24 hour(s))  CBC     Status: Abnormal   Collection Time    08/28/12  2:43 PM      Result Value Range   WBC 8.3  4.0 - 10.5 K/uL   RBC 3.93  3.87 - 5.11 MIL/uL   Hemoglobin 10.3 (*) 12.0 - 15.0 g/dL   HCT 84.6 (*) 96.2 - 95.2 %   MCV 78.9  78.0 - 100.0 fL   MCH 26.2  26.0 - 34.0 pg   MCHC 33.2  30.0 - 36.0 g/dL   RDW 84.1  32.4 - 40.1 %   Platelets 175  150 - 400 K/uL   MAU Course  Procedures 1. SSE 2. cbc  Assessment and Plan  1. PPD#4 2. CBC improving 3.Scant VB on exam w/ no clots 4. PP PreEclampsia; s/p Mag sulfate on PPD#2 5. Newborn remains in NICU 6. Persisting anemia  1. D/c'd from MAU (going upstairs to visit newborn in NICU) 2. Bleeding and PIH precautions rev'd; enc'd pt to try lactation and to keep bladder emptied frequently 3. Has smart start BP check tomorrow 4. F/u prn 5. Support given r/e newborn's continued hospitalization  Eyvonne Burchfield H 08/28/2012, 4:44 PM

## 2012-08-28 NOTE — MAU Note (Signed)
Pt post partum  Delivered Wed 08/24/12. Pt reports passing large blood clot today. Denies any pain.

## 2012-12-30 ENCOUNTER — Emergency Department (HOSPITAL_COMMUNITY)
Admission: EM | Admit: 2012-12-30 | Discharge: 2012-12-30 | Disposition: A | Discharge status: Home / Self Care | Payer: Medicaid Other | Attending: Emergency Medicine | Admitting: Emergency Medicine

## 2012-12-30 ENCOUNTER — Encounter (HOSPITAL_COMMUNITY): Payer: Self-pay | Admitting: Emergency Medicine

## 2012-12-30 DIAGNOSIS — Z8619 Personal history of other infectious and parasitic diseases: Secondary | ICD-10-CM | POA: Insufficient documentation

## 2012-12-30 DIAGNOSIS — H9209 Otalgia, unspecified ear: Secondary | ICD-10-CM | POA: Insufficient documentation

## 2012-12-30 DIAGNOSIS — J029 Acute pharyngitis, unspecified: Secondary | ICD-10-CM | POA: Insufficient documentation

## 2012-12-30 MED ORDER — HYDROCODONE-ACETAMINOPHEN 7.5-325 MG/15ML PO SOLN: 15.0000 mL | Freq: Four times a day (QID) | ORAL | Status: DC | PRN

## 2012-12-30 NOTE — ED Provider Notes (Signed)
Medical screening examination/treatment/procedure(s) were performed by non-physician practitioner and as supervising physician I was immediately available for consultation/collaboration.  EKG Interpretation   None         Layla Maw Prisca Gearing, DO 12/30/12 2255

## 2012-12-30 NOTE — ED Provider Notes (Signed)
CSN: [id number]     Arrival date & time 12/30/12  1751 History  This chart was scribed for Shelley Helper, PA working with [name], DO, by [facility] ED Scribe. This patient was seen in room WTR8/WTR8 and the patient's care was started at 6:55 PM.   Chief Complaint  Patient presents with  . Sore Throat    The history is provided by the patient. No language interpreter was used.    HPI Comments: Shelley Mckinney is a 22 y.o. Female with a history of strep throat who presents to the Emergency Department complaining of a gradually worsening, constant, moderate sore throat over the past 3 days. She reports associated mild right ear pain onset gradually over the past 3 days. She states that she has not tried any medications for symptoms. She denies rhinorrhea, sneezing, coughing, fever, chills, abdominal pain, rash   Past Medical History  Diagnosis Date  . Strep throat   . Chlamydia   . Infection     PARTNER + CHLAMYDIA  . Infection     FREQUENT YEAST  . Infection     BV  . NSVD (normal spontaneous vaginal delivery) 08/24/2012    2311   Past Surgical History  Procedure Laterality Date  . Therapeutic abortion      x3   Family History  Problem Relation Age of Onset  . Drug abuse Mother   . Drug abuse Father   . Diabetes Sister   . Down syndrome Maternal Uncle   . Cancer Paternal Uncle   . Diabetes Paternal Grandmother   . Cancer Paternal Grandfather     COLON  . Down syndrome Cousin     PATERNAL   History  Substance Use Topics  . Smoking status: Never Smoker   . Smokeless tobacco: Never Used  . Alcohol Use: Yes     Comment: RARELY   OB History   Grav Para Term Preterm Abortions TAB SAB Ect Mult Living   5 2 2  3 3    2      Review of Systems  Constitutional: Negative for fever and chills.  HENT: Positive for ear pain (right ear) and sore throat. Negative for rhinorrhea and sneezing.   Respiratory: Negative for cough.   Gastrointestinal: Negative for abdominal  pain.  Skin: Negative for rash.  All other systems reviewed and are negative.   Allergies  Review of patient's allergies indicates no known allergies.  Home Medications  No current outpatient prescriptions on file.  Triage Vitals: BP 118/62  Pulse 88  Temp(Src) 98.6 F (37 C) (Oral)  Resp 20  SpO2 100%  Physical Exam  Nursing note and vitals reviewed. Constitutional: She is oriented to person, place, and time. She appears well-developed and well-nourished. No distress.  HENT:  Head: Normocephalic and atraumatic.  Uvula midline. MIld bilateral tonsillar enlargement with exudates present. No signficant posterior oropharyngeal erythema.  Eyes: EOM are normal.  Neck: Neck supple. No tracheal deviation present.  Cardiovascular: Normal rate and regular rhythm.   Pulmonary/Chest: Effort normal and breath sounds normal. No respiratory distress. She has no wheezes.  Abdominal: There is no tenderness.  No splenomegaly.  Musculoskeletal: Normal range of motion.  Lymphadenopathy:    Cervical adenopathy: Anterior.  Neurological: She is alert and oriented to person, place, and time.  Skin: Skin is warm and dry.  Psychiatric: She has a normal mood and affect. Her behavior is normal.    ED Course  Procedures (including critical care time)  DIAGNOSTIC STUDIES: Oxygen Saturation is 100% on RA, normal by my interpretation.    COORDINATION OF CARE: 7:00 PM- Discussed plan to obtain a strep test. Pt advised of plan for treatment and pt agrees.  8:04 PM Strep neg, likely viral pharyngitis.  Doubt mono.  hycet given for sxs relief.  Care instruction provided.  ENT referral as needed.    Labs Review Labs Reviewed  RAPID STREP SCREEN  CULTURE, GROUP A STREP   Imaging Review No results found.  EKG Interpretation   None       MDM   1. Viral pharyngitis    BP 118/62  Pulse 88  Temp(Src) 98.6 F (37 C) (Oral)  Resp 20  SpO2 100%   I personally performed the services  described in this documentation, which was scribed in my presence. The recorded information has been reviewed and is accurate.     Shelley Helper, PA-C 12/30/12 2005

## 2012-12-30 NOTE — ED Notes (Addendum)
Pt c/o sore throat x 3 days, notice white coating about 3 hours prior to coming. Denies any other symptoms.

## 2013-01-01 LAB — CULTURE, GROUP A STREP

## 2013-03-02 NOTE — L&D Delivery Note (Signed)
Delivery Note Pt quickly progressed to complete dilation and pushed well. At 10:17 AM a healthy female was delivered via  (Presentation: LOA).  APGAR:8 , 9; weight pending.   Placenta status: delivered spontaneously , .  Cord:  with the following complications:none .   Anesthesia:  epidural Episiotomy: none Lacerations: none, periurethral abrasions, hemostatic Suture Repair:n/a Est. Blood Loss (mL): 300cc  Mom to postpartum.  Baby to Couplet care / Skin to Skin.  Oliver Pila 10/25/2013, 10:28 AM

## 2013-03-13 ENCOUNTER — Inpatient Hospital Stay (HOSPITAL_COMMUNITY)
Admission: AD | Admit: 2013-03-13 | Discharge: 2013-03-13 | Disposition: A | Discharge status: Home / Self Care | Payer: Medicaid Other | Source: Ambulatory Visit | Attending: Family Medicine | Admitting: Family Medicine

## 2013-03-13 ENCOUNTER — Encounter (HOSPITAL_COMMUNITY): Payer: Self-pay | Admitting: *Deleted

## 2013-03-13 ENCOUNTER — Inpatient Hospital Stay (HOSPITAL_COMMUNITY): Payer: Medicaid Other

## 2013-03-13 DIAGNOSIS — O200 Threatened abortion: Secondary | ICD-10-CM

## 2013-03-13 DIAGNOSIS — O209 Hemorrhage in early pregnancy, unspecified: Secondary | ICD-10-CM | POA: Insufficient documentation

## 2013-03-13 DIAGNOSIS — R109 Unspecified abdominal pain: Secondary | ICD-10-CM | POA: Insufficient documentation

## 2013-03-13 LAB — URINALYSIS, ROUTINE W REFLEX MICROSCOPIC
Bilirubin Urine: NEGATIVE
GLUCOSE, UA: NEGATIVE mg/dL
Ketones, ur: NEGATIVE mg/dL
Leukocytes, UA: NEGATIVE
Nitrite: NEGATIVE
PROTEIN: NEGATIVE mg/dL
Specific Gravity, Urine: 1.025 (ref 1.005–1.030)
UROBILINOGEN UA: 1 mg/dL (ref 0.0–1.0)
pH: 7 (ref 5.0–8.0)

## 2013-03-13 LAB — CBC
HEMATOCRIT: 34.4 % — AB (ref 36.0–46.0)
Hemoglobin: 11.5 g/dL — ABNORMAL LOW (ref 12.0–15.0)
MCH: 26.3 pg (ref 26.0–34.0)
MCHC: 33.4 g/dL (ref 30.0–36.0)
MCV: 78.5 fL (ref 78.0–100.0)
Platelets: 185 10*3/uL (ref 150–400)
RBC: 4.38 MIL/uL (ref 3.87–5.11)
RDW: 12.9 % (ref 11.5–15.5)
WBC: 4.3 10*3/uL (ref 4.0–10.5)

## 2013-03-13 LAB — HCG, QUANTITATIVE, PREGNANCY: HCG, BETA CHAIN, QUANT, S: 17896 m[IU]/mL — AB (ref <5)

## 2013-03-13 LAB — WET PREP, GENITAL
Trich, Wet Prep: NONE SEEN
YEAST WET PREP: NONE SEEN

## 2013-03-13 LAB — POCT PREGNANCY, URINE: Preg Test, Ur: POSITIVE — AB

## 2013-03-13 LAB — URINE MICROSCOPIC-ADD ON

## 2013-03-13 MED ORDER — RHO D IMMUNE GLOBULIN 1500 UNIT/2ML IJ SOLN
300.0000 ug | Freq: Once | INTRAMUSCULAR | Status: AC
  Administered 2013-03-13: 300 ug via INTRAMUSCULAR
  Filled 2013-03-13: qty 2

## 2013-03-13 NOTE — MAU Provider Note (Signed)
CSN: [id number]     Arrival date & time 03/13/13  1312 History   None    Chief Complaint  Patient presents with  . Vaginal Bleeding   (Consider location/radiation/quality/duration/timing/severity/associated sxs/prior Treatment) HPI Shelley Mckinney is a 23 y.o. [id number] who presents to the ED with Patient's last menstrual period was 03/12/2013. Last normal period was in October. In November had bleeding x 1 day. In December no period. History of irregular menses.   Past Medical History  Diagnosis Date  . Strep throat   . Chlamydia   . Infection     PARTNER + CHLAMYDIA  . Infection     FREQUENT YEAST  . Infection     BV  . NSVD (normal spontaneous vaginal delivery) 08/24/2012    2311   Past Surgical History  Procedure Laterality Date  . Therapeutic abortion      x3   Family History  Problem Relation Age of Onset  . Drug abuse Mother   . Drug abuse Father   . Diabetes Sister   . Down syndrome Maternal Uncle   . Cancer Paternal Uncle   . Diabetes Paternal Grandmother   . Cancer Paternal Grandfather     COLON  . Down syndrome Cousin     PATERNAL   History  Substance Use Topics  . Smoking status: Never Smoker   . Smokeless tobacco: Never Used  . Alcohol Use: No     Comment: RARELY   OB History   Grav Para Term Preterm Abortions TAB SAB Ect Mult Living   6 2 2  3 3    2      Review of Systems  Constitutional: Negative for fever and chills.  HENT: Negative.   Eyes: Negative for visual disturbance.  Respiratory: Negative for cough and shortness of breath.   Cardiovascular: Negative for chest pain.  Gastrointestinal: Negative for nausea and vomiting. Abdominal pain: cramping off and on.  Genitourinary: Positive for vaginal bleeding. Negative for dysuria, urgency and frequency.  Musculoskeletal: Negative for back pain and myalgias.  Skin: Negative for rash.  Allergic/Immunologic: Negative for immunocompromised state.  Neurological: Negative for syncope and headaches.   Psychiatric/Behavioral: Negative for confusion. The patient is not nervous/anxious.     Allergies  Review of patient's allergies indicates no known allergies.  Home Medications  No current outpatient prescriptions on file. BP 113/63  Pulse 98  Temp(Src) 98.1 F (36.7 C) (Oral)  Resp 18  Ht 4\' 11"  (1.499 m)  Wt 108 lb 6.4 oz (49.17 kg)  BMI 21.88 kg/m2  LMP 03/12/2013 Physical Exam  Nursing note and vitals reviewed. Constitutional: She is oriented to person, place, and time. She appears well-developed and well-nourished. No distress.  HENT:  Head: Normocephalic and atraumatic.  Eyes: EOM are normal.  Neck: Neck supple.  Cardiovascular: Normal rate.   Pulmonary/Chest: Effort normal.  Abdominal: Soft. There is no tenderness.  Genitourinary:  External genitalia without lesions. Small blood vaginal vault. Cervix closed, long, no CMT, no adnexal tenderness or mass palpated. Uterus without palpable enlargement.  Musculoskeletal: Normal range of motion.  Neurological: She is alert and oriented to person, place, and time. No cranial nerve deficit.  Skin: Skin is warm and dry.  Psychiatric: She has a normal mood and affect. Her behavior is normal.    ED Course  Procedures Results for orders placed during the hospital encounter of 03/13/13 (from the past 24 hour(s))  URINALYSIS, ROUTINE W REFLEX MICROSCOPIC     Status: Abnormal  Collection Time    03/13/13  1:55 PM      Result Value Range   Color, Urine YELLOW  YELLOW   APPearance CLEAR  CLEAR   Specific Gravity, Urine 1.025  1.005 - 1.030   pH 7.0  5.0 - 8.0   Glucose, UA NEGATIVE  NEGATIVE mg/dL   Hgb urine dipstick LARGE (*) NEGATIVE   Bilirubin Urine NEGATIVE  NEGATIVE   Ketones, ur NEGATIVE  NEGATIVE mg/dL   Protein, ur NEGATIVE  NEGATIVE mg/dL   Urobilinogen, UA 1.0  0.0 - 1.0 mg/dL   Nitrite NEGATIVE  NEGATIVE   Leukocytes, UA NEGATIVE  NEGATIVE  URINE MICROSCOPIC-ADD ON     Status: Abnormal   Collection Time     03/13/13  1:55 PM      Result Value Range   Squamous Epithelial / LPF FEW (*) RARE   RBC / HPF 11-20  <3 RBC/hpf   Bacteria, UA RARE  RARE   Urine-Other MUCOUS PRESENT    POCT PREGNANCY, URINE     Status: Abnormal   Collection Time    03/13/13  2:19 PM      Result Value Range   Preg Test, Ur POSITIVE (*) NEGATIVE  CBC     Status: Abnormal   Collection Time    03/13/13  2:24 PM      Result Value Range   WBC 4.3  4.0 - 10.5 K/uL   RBC 4.38  3.87 - 5.11 MIL/uL   Hemoglobin 11.5 (*) 12.0 - 15.0 g/dL   HCT 82.9 (*) 56.2 - 13.0 %   MCV 78.5  78.0 - 100.0 fL   MCH 26.3  26.0 - 34.0 pg   MCHC 33.4  30.0 - 36.0 g/dL   RDW 86.5  78.4 - 69.6 %   Platelets 185  150 - 400 K/uL  HCG, QUANTITATIVE, PREGNANCY     Status: Abnormal   Collection Time    03/13/13  2:24 PM      Result Value Range   hCG, Beta Chain, Quant, S 17896 (*) <5 mIU/mL  RH IG WORKUP (INCLUDES ABO/RH)     Status: None   Collection Time    03/13/13  2:50 PM      Result Value Range   Gestational Age(Wks) 10     ABO/RH(D) B NEG     Antibody Screen PENDING    WET PREP, GENITAL     Status: Abnormal   Collection Time    03/13/13  3:06 PM      Result Value Range   Yeast Wet Prep HPF POC NONE SEEN  NONE SEEN   Trich, Wet Prep NONE SEEN  NONE SEEN   Clue Cells Wet Prep HPF POC FEW (*) NONE SEEN   WBC, Wet Prep HPF POC FEW (*) NONE SEEN     MDM  US Ob Comp Less 14 Wks  03/13/2013   CLINICAL DATA:  Bleeding.  Cramping.  EXAM: OBSTETRIC <14 WK [place] AND TRANSVAGINAL OB US  TECHNIQUE: Both transabdominal and transvaginal ultrasound examinations were performed for complete evaluation of the gestation as well as the maternal uterus, adnexal regions, and pelvic cul-de-sac. Transvaginal technique was performed to assess early pregnancy.  COMPARISON:  Ultrasound pelvis 04/26/2012  FINDINGS: Intrauterine gestational sac: Visualized/normal in shape.  Yolk sac:  Present  Embryo:  Present  Cardiac Activity: Present  Heart Rate:  85 bpm   CRL:   2.8 mm  mm   5 w 6 d  [place] EDC: 11/07/2013  Maternal uterus/adnexae: Moderate subchorionic hematoma. Probable right corpus luteal cyst. No free-fluid  IMPRESSION: Single live intrauterine gestation 5 weeks 6 days by crown-rump length.  Moderate subchorionic hematoma.   Electronically Signed   By: Annia Belt M.D.   On: 03/13/2013 16:26   US Ob Transvaginal  03/13/2013   CLINICAL DATA:  Bleeding.  Cramping.  EXAM: OBSTETRIC <14 WK [place] AND TRANSVAGINAL OB US  TECHNIQUE: Both transabdominal and transvaginal ultrasound examinations were performed for complete evaluation of the gestation as well as the maternal uterus, adnexal regions, and pelvic cul-de-sac. Transvaginal technique was performed to assess early pregnancy.  COMPARISON:  Ultrasound pelvis 04/26/2012  FINDINGS: Intrauterine gestational sac: Visualized/normal in shape.  Yolk sac:  Present  Embryo:  Present  Cardiac Activity: Present  Heart Rate:  85 bpm  CRL:   2.8 mm  mm   5 w 6 d                  [place] EDC: 11/07/2013  Maternal uterus/adnexae: Moderate subchorionic hematoma. Probable right corpus luteal cyst. No free-fluid  IMPRESSION: Single live intrauterine gestation 5 weeks 6 days by crown-rump length.  Moderate subchorionic hematoma.   Electronically Signed   By: Annia Belt M.D.   On: 03/13/2013 16:26    23 y.o. female with vaginal bleeding in early pregnancy. [facility] noted on ultrasound. I have reviewed this patient's vital signs, nurses notes, appropriate labs and imaging.  I have discussed findings with the patient and given her instructions on threatened AB. She is stable for discharge at this time with minimal bleeding. Pregnancy is viable at this time. No ectopic pregnancy. She will start her prenatal care and return here for any problems. Will give Rhogam prior to discharge.

## 2013-03-13 NOTE — MAU Provider Note (Signed)
Attestation of Attending Supervision of Advanced Practitioner (PA/CNM/NP): Evaluation and management procedures were performed by the Advanced Practitioner under my supervision and collaboration.  I have reviewed the Advanced Practitioner's note and chart, and I agree with the management and plan.  Reva BoresPRATT,Amer Alcindor S, MD [facility] Attending 03/13/2013 5:32 PM

## 2013-03-13 NOTE — MAU Note (Signed)
Pt had TAB in September, had normal period in October, bled for one day in November, didn't have a period at all in December.  Light bleeding since yesterday, passed quarter size clot today.  Intermittent abd pain.

## 2013-03-14 ENCOUNTER — Encounter (HOSPITAL_COMMUNITY): Payer: Self-pay | Admitting: *Deleted

## 2013-03-14 ENCOUNTER — Inpatient Hospital Stay (HOSPITAL_COMMUNITY)
Admission: AD | Admit: 2013-03-14 | Discharge: 2013-03-14 | Disposition: A | Discharge status: Home / Self Care | Payer: Medicaid Other | Source: Ambulatory Visit | Attending: Obstetrics & Gynecology | Admitting: Obstetrics & Gynecology

## 2013-03-14 DIAGNOSIS — O43899 Other placental disorders, unspecified trimester: Secondary | ICD-10-CM

## 2013-03-14 DIAGNOSIS — R109 Unspecified abdominal pain: Secondary | ICD-10-CM | POA: Insufficient documentation

## 2013-03-14 DIAGNOSIS — O468X1 Other antepartum hemorrhage, first trimester: Secondary | ICD-10-CM

## 2013-03-14 DIAGNOSIS — O468X9 Other antepartum hemorrhage, unspecified trimester: Secondary | ICD-10-CM | POA: Insufficient documentation

## 2013-03-14 DIAGNOSIS — O418X10 Other specified disorders of amniotic fluid and membranes, first trimester, not applicable or unspecified: Secondary | ICD-10-CM

## 2013-03-14 HISTORY — DX: Gestational (pregnancy-induced) hypertension without significant proteinuria, unspecified trimester: O13.9

## 2013-03-14 LAB — RH IG WORKUP (INCLUDES ABO/RH)
ABO/RH(D): B NEG
Antibody Screen: POSITIVE
DAT, IGG: NEGATIVE
Gestational Age(Wks): 10
UNIT DIVISION: 0

## 2013-03-14 LAB — CBC
HCT: 33.1 % — ABNORMAL LOW (ref 36.0–46.0)
Hemoglobin: 11 g/dL — ABNORMAL LOW (ref 12.0–15.0)
MCH: 26.4 pg (ref 26.0–34.0)
MCHC: 33.2 g/dL (ref 30.0–36.0)
MCV: 79.6 fL (ref 78.0–100.0)
PLATELETS: 194 10*3/uL (ref 150–400)
RBC: 4.16 MIL/uL (ref 3.87–5.11)
RDW: 13 % (ref 11.5–15.5)
WBC: 4.7 10*3/uL (ref 4.0–10.5)

## 2013-03-14 LAB — HCG, QUANTITATIVE, PREGNANCY: hCG, Beta Chain, Quant, S: 19773 m[IU]/mL — ABNORMAL HIGH (ref <5)

## 2013-03-14 MED ORDER — ACETAMINOPHEN 500 MG PO TABS
1000.0000 mg | ORAL_TABLET | Freq: Once | ORAL | Status: AC
  Administered 2013-03-14: 1000 mg via ORAL
  Filled 2013-03-14: qty 2

## 2013-03-14 NOTE — Discharge Instructions (Signed)
Subchorionic Hematoma °A subchorionic hematoma is a gathering of blood between the outer wall of the placenta and the inner wall of the womb (uterus). The placenta is the organ that connects the fetus to the wall of the uterus. The placenta performs the feeding, breathing (oxygen to the fetus), and waste removal (excretory work) of the fetus.  °Subchorionic hematoma is the most common abnormality found on a result from ultrasonography done during the first trimester or early second trimester of pregnancy. If there has been little or no vaginal bleeding, early small hematomas usually shrink on their own and do not affect your baby or pregnancy. The blood is gradually absorbed over 1 2 weeks. When bleeding starts later in pregnancy or the hematoma is larger or occurs in an older pregnant woman, the outcome may not be as good. Larger hematomas may get bigger, which increases the chances for miscarriage. Subchorionic hematoma also increases the risk of premature detachment of the placenta from the uterus, preterm (premature) labor, and stillbirth. °HOME CARE INSTRUCTIONS  °· Stay on bed rest if your health care provider recommends this. Although bed rest will not prevent more bleeding or prevent a miscarriage, your health care provider may recommend bed rest until you are advised otherwise. °· Avoid heavy lifting (more than 10 lb [4.5 kg]), exercise, sexual intercourse, or douching as directed by your health care provider. °· Keep track of the number of pads you use each day and how soaked (saturated) they are. Write down this information. °· Do not use tampons. °· Keep all follow-up appointments as directed by your health care provider. Your health care provider may ask you to have follow-up blood tests or ultrasound tests or both. °SEEK IMMEDIATE MEDICAL CARE IF:  °· You have severe cramps in your stomach, back, abdomen, or pelvis. °· You have a fever. °· You pass large clots or tissue. Save any tissue for your health  care provider to look at. °· Your bleeding increases or you become lightheaded, feel weak, or have fainting episodes. °Document Released: 06/03/2006 Document Revised: 12/07/2012 Document Reviewed: 09/15/2012 °[employer]® Patient Information ©2014 [employer]. ° °

## 2013-03-14 NOTE — MAU Note (Signed)
PT  WAS SEEN HERE YESTERDAY FOR  BLEEDING.  HAD U/S-    SAYS BLEEDING IS SAME AS YESTERDAY,   BUT CRAMPING STARTED  AT 840PM.   PAD  ON IN  TRIAGE- 1 RED STREAK.

## 2013-03-14 NOTE — MAU Provider Note (Signed)
History     CSN: [id number]  Arrival date and time: 03/14/13 2058   First Provider Initiated Contact with Patient 03/14/13 2139      No chief complaint on file.  HPI  Shelley Mckinney is a 23 y.o. [id number] who presents today with bleeding. She was seen yesterday, and was dx with a moderate subchorionic hemorrhage. She states that she has been bleeding since yesterday, but today she is having some craps too. She states that she has not passed any tissue that she has seen. She has not taken anything for the cramps at this time.   Past Medical History  Diagnosis Date  . Strep throat   . Chlamydia   . Infection     PARTNER + CHLAMYDIA  . Infection     FREQUENT YEAST  . Infection     BV  . NSVD (normal spontaneous vaginal delivery) 08/24/2012    2311  . Pregnancy induced hypertension 2014    post partum HTN    Past Surgical History  Procedure Laterality Date  . Therapeutic abortion      x3    Family History  Problem Relation Age of Onset  . Drug abuse Mother   . Drug abuse Father   . Diabetes Sister   . Down syndrome Maternal Uncle   . Cancer Paternal Uncle   . Diabetes Paternal Grandmother   . Cancer Paternal Grandfather     COLON  . Down syndrome Cousin     PATERNAL    History  Substance Use Topics  . Smoking status: Never Smoker   . Smokeless tobacco: Never Used  . Alcohol Use: No     Comment: RARELY    Allergies: No Known Allergies  No prescriptions prior to admission    ROS Physical Exam   Blood pressure 105/58, pulse 94, temperature 98.5 F (36.9 C), temperature source Oral, resp. rate 20, height 4\' 9"  (1.448 m), weight 48.081 kg (106 lb), last menstrual period 03/12/2013.  Physical Exam  Nursing note and vitals reviewed. Constitutional: She is oriented to person, place, and time. She appears well-developed and well-nourished. No distress.  Cardiovascular: Normal rate.   Respiratory: Effort normal.  GI: Soft. There is no tenderness.   Genitourinary:  Quarter sized spot of blood on her pad.   Neurological: She is alert and oriented to person, place, and time.  Skin: Skin is warm and dry.  Psychiatric: She has a normal mood and affect.    MAU Course  Procedures  Results for orders placed during the hospital encounter of 03/14/13 (from the past 24 hour(s))  HCG, QUANTITATIVE, PREGNANCY     Status: Abnormal   Collection Time    03/14/13  9:30 PM      Result Value Range   hCG, Beta Chain, Sharene Butters, [place] [address] (*) <5 mIU/mL  CBC     Status: Abnormal   Collection Time    03/14/13  9:30 PM      Result Value Range   WBC 4.7  4.0 - 10.5 K/uL   RBC 4.16  3.87 - 5.11 MIL/uL   Hemoglobin 11.0 (*) 12.0 - 15.0 g/dL   HCT 95.6 (*) 21.3 - 08.6 %   MCV 79.6  78.0 - 100.0 fL   MCH 26.4  26.0 - 34.0 pg   MCHC 33.2  30.0 - 36.0 g/dL   RDW 57.8  46.9 - 62.9 %   Platelets 194  150 - 400 K/uL     Assessment and  Plan   1. Subchorionic hematoma in first trimester    Bleeding precautions Miscarriage warning signs reviewed Return to MAU as needed [facility]  Follow-up Information   Schedule an appointment as soon as possible for a visit with [facility] [place].   Contact information:   [address] [place] [address] [phone]      Tawnya CrookHogan, Heather Donovan 03/14/2013, 9:44 PM

## 2013-03-16 LAB — GC/CHLAMYDIA PROBE AMP
CT PROBE, AMP APTIMA: NEGATIVE
GC PROBE AMP APTIMA: NEGATIVE

## 2013-03-23 NOTE — MAU Provider Note (Signed)
IUP on US 03/13/13  Attestation of Attending Supervision of Advanced Practitioner (CNM/NP): Evaluation and management procedures were performed by the Advanced Practitioner under my supervision and collaboration. I have reviewed the Advanced Practitioner's note and chart, and I agree with the management and plan.  Tauren Delbuono H. 4:36 PM

## 2013-05-07 ENCOUNTER — Encounter (HOSPITAL_COMMUNITY): Payer: Self-pay

## 2013-05-07 ENCOUNTER — Inpatient Hospital Stay (HOSPITAL_COMMUNITY)
Admission: AD | Admit: 2013-05-07 | Discharge: 2013-05-07 | Disposition: A | Discharge status: Home / Self Care | Payer: Medicaid Other | Source: Ambulatory Visit | Attending: Obstetrics & Gynecology | Admitting: Obstetrics & Gynecology

## 2013-05-07 DIAGNOSIS — O9989 Other specified diseases and conditions complicating pregnancy, childbirth and the puerperium: Principal | ICD-10-CM

## 2013-05-07 DIAGNOSIS — O99612 Diseases of the digestive system complicating pregnancy, second trimester: Secondary | ICD-10-CM

## 2013-05-07 DIAGNOSIS — K5900 Constipation, unspecified: Secondary | ICD-10-CM

## 2013-05-07 DIAGNOSIS — R51 Headache: Secondary | ICD-10-CM | POA: Insufficient documentation

## 2013-05-07 DIAGNOSIS — O99891 Other specified diseases and conditions complicating pregnancy: Secondary | ICD-10-CM

## 2013-05-07 DIAGNOSIS — O210 Mild hyperemesis gravidarum: Secondary | ICD-10-CM | POA: Insufficient documentation

## 2013-05-07 DIAGNOSIS — O219 Vomiting of pregnancy, unspecified: Secondary | ICD-10-CM

## 2013-05-07 HISTORY — DX: Anemia, unspecified: D64.9

## 2013-05-07 LAB — URINALYSIS, ROUTINE W REFLEX MICROSCOPIC
BILIRUBIN URINE: NEGATIVE
Glucose, UA: NEGATIVE mg/dL
Hgb urine dipstick: NEGATIVE
KETONES UR: NEGATIVE mg/dL
Leukocytes, UA: NEGATIVE
NITRITE: NEGATIVE
PH: 6.5 (ref 5.0–8.0)
PROTEIN: NEGATIVE mg/dL
Specific Gravity, Urine: 1.025 (ref 1.005–1.030)
Urobilinogen, UA: 0.2 mg/dL (ref 0.0–1.0)

## 2013-05-07 MED ORDER — PROMETHAZINE HCL 12.5 MG PO TABS: 12.5000 mg | ORAL_TABLET | Freq: Four times a day (QID) | ORAL | Status: DC | PRN

## 2013-05-07 MED ORDER — DOCUSATE SODIUM 100 MG PO CAPS: 100.0000 mg | ORAL_CAPSULE | Freq: Two times a day (BID) | ORAL | Status: DC

## 2013-05-07 NOTE — Discharge Instructions (Signed)
Constipation, Adult °Constipation is when a person has fewer than 3 bowel movements a week; has difficulty having a bowel movement; or has stools that are dry, hard, or larger than normal. As people grow older, constipation is more common. If you try to fix constipation with medicines that make you have a bowel movement (laxatives), the problem may get worse. Long-term laxative use may cause the muscles of the colon to become weak. A low-fiber diet, not taking in enough fluids, and taking certain medicines may make constipation worse. °CAUSES  °· Certain medicines, such as antidepressants, pain medicine, iron supplements, antacids, and water pills.   °· Certain diseases, such as diabetes, irritable bowel syndrome (IBS), thyroid disease, or depression.   °· Not drinking enough water.   °· Not eating enough fiber-rich foods.   °· Stress or travel. °· Lack of physical activity or exercise. °· Not going to the restroom when there is the urge to have a bowel movement. °· Ignoring the urge to have a bowel movement. °· Using laxatives too much. °SYMPTOMS  °· Having fewer than 3 bowel movements a week.   °· Straining to have a bowel movement.   °· Having hard, dry, or larger than normal stools.   °· Feeling full or bloated.   °· Pain in the lower abdomen. °· Not feeling relief after having a bowel movement. °DIAGNOSIS  °Your caregiver will take a medical history and perform a physical exam. Further testing may be done for severe constipation. Some tests may include:  °· A barium enema X-ray to examine your rectum, colon, and sometimes, your small intestine. °· A sigmoidoscopy to examine your lower colon. °· A colonoscopy to examine your entire colon. °TREATMENT  °Treatment will depend on the severity of your constipation and what is causing it. Some dietary treatments include drinking more fluids and eating more fiber-rich foods. Lifestyle treatments may include regular exercise. If these diet and lifestyle recommendations  do not help, your caregiver may recommend taking over-the-counter laxative medicines to help you have bowel movements. Prescription medicines may be prescribed if over-the-counter medicines do not work.  °HOME CARE INSTRUCTIONS  °· Increase dietary fiber in your diet, such as fruits, vegetables, whole grains, and beans. Limit high-fat and processed sugars in your diet, such as French fries, hamburgers, cookies, candies, and soda.   °· A fiber supplement may be added to your diet if you cannot get enough fiber from foods.   °· Drink enough fluids to keep your urine clear or pale yellow.   °· Exercise regularly or as directed by your caregiver.   °· Go to the restroom when you have the urge to go. Do not hold it. °· Only take medicines as directed by your caregiver. Do not take other medicines for constipation without talking to your caregiver first. °SEEK IMMEDIATE MEDICAL CARE IF:  °· You have bright red blood in your stool.   °· Your constipation lasts for more than 4 days or gets worse.   °· You have abdominal or rectal pain.   °· You have thin, pencil-like stools. °· You have unexplained weight loss. °MAKE SURE YOU:  °· Understand these instructions. °· Will watch your condition. °· Will get help right away if you are not doing well or get worse. °Document Released: 11/15/2003 Document Revised: 05/11/2011 Document Reviewed: 11/28/2012 °ExitCare® Patient Information ©2014 [employer]. ° °Morning Sickness °Morning sickness is when you feel sick to your stomach (nauseous) during pregnancy. This nauseous feeling may or may not come with vomiting. It often occurs in the morning but can be a   problem any time of day. Morning sickness is most common during the first trimester, but it may continue throughout pregnancy. While morning sickness is unpleasant, it is usually harmless unless you develop severe and continual vomiting (hyperemesis gravidarum). This condition requires more intense treatment.  °CAUSES  °The cause  of morning sickness is not completely known but seems to be related to normal hormonal changes that occur in pregnancy. °RISK FACTORS °You are at greater risk if you: °· Experienced nausea or vomiting before your pregnancy. °· Had morning sickness during a previous pregnancy. °· Are pregnant with more than one baby, such as twins. °TREATMENT  °Do not use any medicines (prescription, over-the-counter, or herbal) for morning sickness without first talking to your health care provider. Your health care provider may prescribe or recommend: °· Vitamin B6 supplements. °· Anti-nausea medicines. °· The herbal medicine ginger. °HOME CARE INSTRUCTIONS  °· Only take over-the-counter or prescription medicines as directed by your health care provider. °· Taking multivitamins before getting pregnant can prevent or decrease the severity of morning sickness in most women.   °· Eat a piece of dry toast or unsalted crackers before getting out of bed in the morning.   °· Eat five or six small meals a day.   °· Eat dry and bland foods (rice, baked potato ). Foods high in carbohydrates are often helpful.  °· Do not drink liquids with your meals. Drink liquids between meals.   °· Avoid greasy, fatty, and spicy foods.   °· Get someone to cook for you if the smell of any food causes nausea and vomiting.   °· If you feel nauseous after taking prenatal vitamins, take the vitamins at night or with a snack.  °· Snack on protein foods (nuts, yogurt, cheese) between meals if you are hungry.   °· Eat unsweetened gelatins for desserts.   °· Wearing an acupressure wristband (worn for sea sickness) may be helpful.   °· Acupuncture may be helpful.   °· Do not smoke.   °· Get a humidifier to keep the air in your house free of odors.   °· Get plenty of fresh air. °SEEK MEDICAL CARE IF:  °· Your home remedies are not working, and you need medicine. °· You feel dizzy or lightheaded. °· You are losing weight. °SEEK IMMEDIATE MEDICAL CARE IF:  °· You have  persistent and uncontrolled nausea and vomiting. °· You pass out (faint). °Document Released: 04/09/2006 Document Revised: 10/19/2012 Document Reviewed: 08/03/2012 °ExitCare® Patient Information ©2014 [employer]. ° ° °

## 2013-05-07 NOTE — MAU Note (Signed)
Has had several episodes of seeing black spots & getting dizzy for the past week; if she closes her eyes and sits down it goes away; had not had syncopal episode. Had headache every morning when she wakes up that goes away after a few hours; headaches x 1 week that is not associated with dizziness and black spots. Denies abdominal cramping/vaginal bleeding/vaginal discharge.

## 2013-05-07 NOTE — MAU Provider Note (Signed)
History     CSN: [id number]  Arrival date and time: 05/07/13 2046   First Provider Initiated Contact with Patient 05/07/13 2113      Chief Complaint  Patient presents with  . Headache  . Dizziness   Headache  Associated symptoms include dizziness.  Dizziness Associated symptoms include headaches.    Shelley Mckinney is a 23 y.o. [id number] at 5020w5d who presents today with dizziness and headaches. She states that they have been ongoing for the past 2 weeks. She states that the headaches usually improve after an hour or two on their own without any medication. She states that the dizziness will only last about 15 seconds and then go away. She states that she has had nausea and vomiting, but she has not taken anything for it. She has had constipation.   Past Medical History  Diagnosis Date  . Strep throat   . Chlamydia   . Infection     PARTNER + CHLAMYDIA  . Infection     FREQUENT YEAST  . Infection     BV  . NSVD (normal spontaneous vaginal delivery) 08/24/2012    2311  . Pregnancy induced hypertension 2014    post partum HTN  . Anemia 2014    during pregnancy    Past Surgical History  Procedure Laterality Date  . Therapeutic abortion      x3    Family History  Problem Relation Age of Onset  . Drug abuse Mother   . Drug abuse Father   . Diabetes Sister   . Down syndrome Maternal Uncle   . Cancer Paternal Uncle   . Diabetes Paternal Grandmother   . Cancer Paternal Grandfather     COLON  . Down syndrome Cousin     PATERNAL    History  Substance Use Topics  . Smoking status: Never Smoker   . Smokeless tobacco: Never Used  . Alcohol Use: No     Comment: RARELY    Allergies: No Known Allergies  Prescriptions prior to admission  Medication Sig Dispense Refill  . acetaminophen (TYLENOL) 325 MG tablet Take 325 mg by mouth every 6 (six) hours as needed for headache.      . Prenatal Vit-Fe Fumarate-FA (PRENATAL MULTIVITAMIN) TABS tablet Take 1 tablet by mouth  daily at 12 noon.        Review of Systems  Neurological: Positive for dizziness and headaches.   Physical Exam   Blood pressure 131/77, pulse 116, temperature 98.8 F (37.1 C), temperature source Oral, resp. rate 18, height 4\' 9"  (1.448 m), weight 50.44 kg (111 lb 3.2 oz), last menstrual period 03/12/2013, SpO2 100.00%, not currently breastfeeding.  Physical Exam  Nursing note and vitals reviewed. Constitutional: She is oriented to person, place, and time. She appears well-developed and well-nourished. No distress.  Cardiovascular: Normal rate.   Respiratory: Effort normal.  GI: Soft. There is no tenderness.  Genitourinary:   Uterus: AGA with FHT 155   Neurological: She is alert and oriented to person, place, and time.  Skin: Skin is warm and dry.  Psychiatric: She has a normal mood and affect.    MAU Course  Procedures    Assessment and Plan   1. Constipation in pregnancy in second trimester   2. Nausea/vomiting in pregnancy    RX: phenergan 12.5 Colace Discussed increased fiber and fluids in diet 2nd trimester precautions reviewed  [facility] as possible Return to MAU as needed  Follow-up Information   Schedule  an appointment as soon as possible for a visit with [facility].   Contact information:   [address] [place] [address] [phone]       Tawnya Crook 05/07/2013, 9:21 PM

## 2013-06-07 LAB — OB RESULTS CONSOLE ABO/RH: RH TYPE: NEGATIVE

## 2013-06-07 LAB — OB RESULTS CONSOLE RPR: RPR: NONREACTIVE

## 2013-06-07 LAB — OB RESULTS CONSOLE ANTIBODY SCREEN: Antibody Screen: NEGATIVE

## 2013-06-07 LAB — OB RESULTS CONSOLE GC/CHLAMYDIA
Chlamydia: NEGATIVE
Gonorrhea: NEGATIVE

## 2013-06-07 LAB — OB RESULTS CONSOLE HIV ANTIBODY (ROUTINE TESTING): HIV: NONREACTIVE

## 2013-06-07 LAB — OB RESULTS CONSOLE RUBELLA ANTIBODY, IGM: Rubella: IMMUNE

## 2013-06-07 LAB — OB RESULTS CONSOLE HEPATITIS B SURFACE ANTIGEN: Hepatitis B Surface Ag: NEGATIVE

## 2013-10-23 ENCOUNTER — Telehealth (HOSPITAL_COMMUNITY): Payer: Self-pay | Admitting: *Deleted

## 2013-10-23 ENCOUNTER — Encounter (HOSPITAL_COMMUNITY): Payer: Self-pay | Admitting: *Deleted

## 2013-10-23 NOTE — Telephone Encounter (Signed)
Preadmission screen  

## 2013-10-25 ENCOUNTER — Encounter (HOSPITAL_COMMUNITY): Payer: Self-pay | Admitting: Obstetrics and Gynecology

## 2013-10-25 ENCOUNTER — Encounter (HOSPITAL_COMMUNITY): Payer: Medicaid Other | Admitting: Anesthesiology

## 2013-10-25 ENCOUNTER — Inpatient Hospital Stay (HOSPITAL_COMMUNITY)
Admission: AD | Admit: 2013-10-25 | Discharge: 2013-10-27 | DRG: 774 | Disposition: A | Discharge status: Home / Self Care | Payer: Medicaid Other | Source: Ambulatory Visit | Attending: Obstetrics and Gynecology | Admitting: Obstetrics and Gynecology

## 2013-10-25 ENCOUNTER — Inpatient Hospital Stay (HOSPITAL_COMMUNITY): Payer: Medicaid Other | Admitting: Anesthesiology

## 2013-10-25 DIAGNOSIS — D649 Anemia, unspecified: Secondary | ICD-10-CM | POA: Diagnosis present

## 2013-10-25 DIAGNOSIS — Z3490 Encounter for supervision of normal pregnancy, unspecified, unspecified trimester: Secondary | ICD-10-CM

## 2013-10-25 DIAGNOSIS — O1002 Pre-existing essential hypertension complicating childbirth: Secondary | ICD-10-CM | POA: Diagnosis present

## 2013-10-25 DIAGNOSIS — Z22330 Carrier of Group B streptococcus: Secondary | ICD-10-CM

## 2013-10-25 DIAGNOSIS — O9902 Anemia complicating childbirth: Secondary | ICD-10-CM | POA: Diagnosis present

## 2013-10-25 DIAGNOSIS — Z833 Family history of diabetes mellitus: Secondary | ICD-10-CM

## 2013-10-25 DIAGNOSIS — O4290 Premature rupture of membranes, unspecified as to length of time between rupture and onset of labor, unspecified weeks of gestation: Principal | ICD-10-CM | POA: Diagnosis present

## 2013-10-25 DIAGNOSIS — O99892 Other specified diseases and conditions complicating childbirth: Secondary | ICD-10-CM | POA: Diagnosis present

## 2013-10-25 DIAGNOSIS — O9989 Other specified diseases and conditions complicating pregnancy, childbirth and the puerperium: Secondary | ICD-10-CM

## 2013-10-25 HISTORY — DX: Premature rupture of membranes, unspecified as to length of time between rupture and onset of labor, unspecified weeks of gestation: O42.90

## 2013-10-25 LAB — CBC
HEMATOCRIT: 30.7 % — AB (ref 36.0–46.0)
Hemoglobin: 9.8 g/dL — ABNORMAL LOW (ref 12.0–15.0)
MCH: 23.2 pg — ABNORMAL LOW (ref 26.0–34.0)
MCHC: 31.9 g/dL (ref 30.0–36.0)
MCV: 72.7 fL — ABNORMAL LOW (ref 78.0–100.0)
PLATELETS: 159 10*3/uL (ref 150–400)
RBC: 4.22 MIL/uL (ref 3.87–5.11)
RDW: 15.6 % — AB (ref 11.5–15.5)
WBC: 8.1 10*3/uL (ref 4.0–10.5)

## 2013-10-25 LAB — SAMPLE TO BLOOD BANK

## 2013-10-25 LAB — RPR

## 2013-10-25 MED ORDER — ONDANSETRON HCL 4 MG/2ML IJ SOLN: 4.0000 mg | INTRAMUSCULAR | Status: DC | PRN

## 2013-10-25 MED ORDER — WITCH HAZEL-GLYCERIN EX PADS: 1.0000 "application " | MEDICATED_PAD | CUTANEOUS | Status: DC | PRN

## 2013-10-25 MED ORDER — OXYCODONE-ACETAMINOPHEN 5-325 MG PO TABS: 1.0000 | ORAL_TABLET | ORAL | Status: DC | PRN

## 2013-10-25 MED ORDER — ONDANSETRON HCL 4 MG/2ML IJ SOLN
4.0000 mg | Freq: Four times a day (QID) | INTRAMUSCULAR | Status: DC | PRN
  Administered 2013-10-25: 4 mg via INTRAVENOUS
  Filled 2013-10-25: qty 2

## 2013-10-25 MED ORDER — BENZOCAINE-MENTHOL 20-0.5 % EX AERO: 1.0000 "application " | INHALATION_SPRAY | CUTANEOUS | Status: DC | PRN

## 2013-10-25 MED ORDER — FENTANYL 2.5 MCG/ML BUPIVACAINE 1/10 % EPIDURAL INFUSION (WH - ANES): 12.0000 mL/h | INTRAMUSCULAR | Status: DC | PRN

## 2013-10-25 MED ORDER — LANOLIN HYDROUS EX OINT: TOPICAL_OINTMENT | CUTANEOUS | Status: DC | PRN

## 2013-10-25 MED ORDER — LACTATED RINGERS IV SOLN
500.0000 mL | Freq: Once | INTRAVENOUS | Status: AC
  Administered 2013-10-25: 500 mL via INTRAVENOUS

## 2013-10-25 MED ORDER — LIDOCAINE HCL (PF) 1 % IJ SOLN
30.0000 mL | INTRAMUSCULAR | Status: DC | PRN
  Filled 2013-10-25: qty 30

## 2013-10-25 MED ORDER — TERBUTALINE SULFATE 1 MG/ML IJ SOLN: 0.2500 mg | Freq: Once | INTRAMUSCULAR | Status: DC | PRN

## 2013-10-25 MED ORDER — OXYTOCIN 40 UNITS IN LACTATED RINGERS INFUSION - SIMPLE MED: 62.5000 mL/h | INTRAVENOUS | Status: DC

## 2013-10-25 MED ORDER — IBUPROFEN 600 MG PO TABS
600.0000 mg | ORAL_TABLET | Freq: Four times a day (QID) | ORAL | Status: DC | PRN
  Administered 2013-10-25: 600 mg via ORAL
  Filled 2013-10-25: qty 1

## 2013-10-25 MED ORDER — IBUPROFEN 600 MG PO TABS
600.0000 mg | ORAL_TABLET | Freq: Four times a day (QID) | ORAL | Status: DC
  Administered 2013-10-25 – 2013-10-27 (×7): 600 mg via ORAL
  Filled 2013-10-25 (×7): qty 1

## 2013-10-25 MED ORDER — PHENYLEPHRINE 40 MCG/ML (10ML) SYRINGE FOR IV PUSH (FOR BLOOD PRESSURE SUPPORT)
80.0000 ug | PREFILLED_SYRINGE | INTRAVENOUS | Status: DC | PRN
  Filled 2013-10-25: qty 10
  Filled 2013-10-25: qty 2

## 2013-10-25 MED ORDER — OXYTOCIN 40 UNITS IN LACTATED RINGERS INFUSION - SIMPLE MED
1.0000 m[IU]/min | INTRAVENOUS | Status: DC
  Administered 2013-10-25: 2 m[IU]/min via INTRAVENOUS
  Filled 2013-10-25: qty 1000

## 2013-10-25 MED ORDER — LIDOCAINE HCL (PF) 1 % IJ SOLN
INTRAMUSCULAR | Status: DC | PRN
  Administered 2013-10-25: 8 mL

## 2013-10-25 MED ORDER — DEXTROSE 5 % IV SOLN
5.0000 10*6.[IU] | Freq: Once | INTRAVENOUS | Status: AC
  Administered 2013-10-25: 5 10*6.[IU] via INTRAVENOUS
  Filled 2013-10-25: qty 5

## 2013-10-25 MED ORDER — OXYTOCIN BOLUS FROM INFUSION: 500.0000 mL | INTRAVENOUS | Status: DC

## 2013-10-25 MED ORDER — ONDANSETRON HCL 4 MG PO TABS
4.0000 mg | ORAL_TABLET | ORAL | Status: DC | PRN
Start: 2013-10-25 — End: 2013-10-27

## 2013-10-25 MED ORDER — SIMETHICONE 80 MG PO CHEW: 80.0000 mg | CHEWABLE_TABLET | ORAL | Status: DC | PRN

## 2013-10-25 MED ORDER — PENICILLIN G POTASSIUM 5000000 UNITS IJ SOLR
2.5000 10*6.[IU] | INTRAVENOUS | Status: DC
  Administered 2013-10-25: 2.5 10*6.[IU] via INTRAVENOUS
  Filled 2013-10-25 (×6): qty 2.5

## 2013-10-25 MED ORDER — LACTATED RINGERS IV SOLN: 500.0000 mL | INTRAVENOUS | Status: DC | PRN

## 2013-10-25 MED ORDER — FENTANYL 2.5 MCG/ML BUPIVACAINE 1/10 % EPIDURAL INFUSION (WH - ANES)
14.0000 mL/h | INTRAMUSCULAR | Status: DC | PRN
  Administered 2013-10-25: 14 mL/h via EPIDURAL
  Filled 2013-10-25: qty 125

## 2013-10-25 MED ORDER — SENNOSIDES-DOCUSATE SODIUM 8.6-50 MG PO TABS
2.0000 | ORAL_TABLET | ORAL | Status: DC
  Administered 2013-10-25: 2 via ORAL
  Filled 2013-10-25: qty 2

## 2013-10-25 MED ORDER — DIPHENHYDRAMINE HCL 25 MG PO CAPS: 25.0000 mg | ORAL_CAPSULE | Freq: Four times a day (QID) | ORAL | Status: DC | PRN

## 2013-10-25 MED ORDER — LACTATED RINGERS IV SOLN
INTRAVENOUS | Status: DC
  Administered 2013-10-25 (×2): via INTRAVENOUS

## 2013-10-25 MED ORDER — DIBUCAINE 1 % RE OINT: 1.0000 "application " | TOPICAL_OINTMENT | RECTAL | Status: DC | PRN

## 2013-10-25 MED ORDER — PRENATAL MULTIVITAMIN CH
1.0000 | ORAL_TABLET | Freq: Every day | ORAL | Status: DC
  Filled 2013-10-25: qty 1

## 2013-10-25 MED ORDER — ZOLPIDEM TARTRATE 5 MG PO TABS
5.0000 mg | ORAL_TABLET | Freq: Every evening | ORAL | Status: DC | PRN
Start: 2013-10-25 — End: 2013-10-27

## 2013-10-25 MED ORDER — DIPHENHYDRAMINE HCL 50 MG/ML IJ SOLN: 12.5000 mg | INTRAMUSCULAR | Status: DC | PRN

## 2013-10-25 MED ORDER — BUTORPHANOL TARTRATE 1 MG/ML IJ SOLN
1.0000 mg | INTRAMUSCULAR | Status: DC | PRN
  Administered 2013-10-25: 1 mg via INTRAVENOUS
  Filled 2013-10-25: qty 1

## 2013-10-25 MED ORDER — TETANUS-DIPHTH-ACELL PERTUSSIS 5-2.5-18.5 LF-MCG/0.5 IM SUSP: 0.5000 mL | Freq: Once | INTRAMUSCULAR | Status: DC

## 2013-10-25 MED ORDER — CITRIC ACID-SODIUM CITRATE 334-500 MG/5ML PO SOLN: 30.0000 mL | ORAL | Status: DC | PRN

## 2013-10-25 MED ORDER — EPHEDRINE 5 MG/ML INJ
10.0000 mg | INTRAVENOUS | Status: DC | PRN
  Filled 2013-10-25: qty 2

## 2013-10-25 MED ORDER — PHENYLEPHRINE 40 MCG/ML (10ML) SYRINGE FOR IV PUSH (FOR BLOOD PRESSURE SUPPORT)
80.0000 ug | PREFILLED_SYRINGE | INTRAVENOUS | Status: DC | PRN
  Filled 2013-10-25: qty 2

## 2013-10-25 MED ORDER — ACETAMINOPHEN 325 MG PO TABS: 650.0000 mg | ORAL_TABLET | ORAL | Status: DC | PRN

## 2013-10-25 NOTE — Anesthesia Postprocedure Evaluation (Signed)
  Anesthesia Post-op Note  Patient: Shelley Mckinney  Procedure(s) Performed: * No procedures listed *  Patient Location: PACU and Mother/Baby  Anesthesia Type:Epidural  Level of Consciousness: awake, alert , oriented and patient cooperative  Airway and Oxygen Therapy: Patient Spontanous Breathing  Post-op Pain: none  Post-op Assessment: Post-op Vital signs reviewed, Patient's Cardiovascular Status Stable, Respiratory Function Stable, Patent Airway, No signs of Nausea or vomiting, Adequate PO intake, Pain level controlled, No headache, No backache, No residual numbness and No residual motor weakness  Post-op Vital Signs: Reviewed and stable  Last Vitals:  Filed Vitals:   10/25/13 1202  BP: 112/66  Pulse: 102  Temp: 36.3 C  Resp: 18    Complications: No apparent anesthesia complications

## 2013-10-25 NOTE — Anesthesia Procedure Notes (Signed)
Epidural Patient location during procedure: OB  Preanesthetic Checklist Completed: patient identified, site marked, surgical consent, pre-op evaluation, timeout performed, IV checked, risks and benefits discussed and monitors and equipment checked  Epidural Patient position: sitting Prep: site prepped and draped and DuraPrep Patient monitoring: continuous pulse ox and blood pressure Approach: midline Injection technique: LOR air  Needle:  Needle type: Tuohy  Needle gauge: 17 G Needle length: 9 cm and 9 Needle insertion depth: 5 cm cm Catheter type: closed end flexible Catheter size: 19 Gauge Catheter at skin depth: 10 cm Test dose: negative  Assessment Events: blood not aspirated, injection not painful, no injection resistance, negative IV test and no paresthesia  Additional Notes Dosing of Epidural:  1st dose, through catheter ............................................. Xylocaine 30 mg  2nd dose, through catheter, after waiting 3 minutes........Xylocaine 30 mg   ( 1% Xylo charted as a single dose in Epic Meds for ease of charting; actual dosing was fractionated as above, for saftey's sake)  As each dose occurred, patient was free of IV sx; and patient exhibited no evidence of SA injection.  Patient is more comfortable after epidural dosed. Please see RN's note for documentation of vital signs,and FHR which are stable.  Patient reminded not to try to ambulate with numb legs, and that an RN must be present the 1st time she attempts to get up.    

## 2013-10-25 NOTE — MAU Note (Signed)
PT  SAYS SROM  AT 0340  CLEAR-  DENIES  BLEEDING,  NO HSV OR MRSA.  GBS- POSITIVE.  HAD VE ON  THURS 2 CM.  PT GROSSLY RUPTURED- -   CHANGED CLOTHES.

## 2013-10-25 NOTE — Progress Notes (Signed)
Patient ID: Shelley Mckinney, female   DOB: 1990-08-19, 24 y.o.   MRN: [id number] Pt just received epidural and comfortable afeb vss FHR Category 1 Cervix 4/100/-1  Pitocin started for augmentation Received PCN for +GBS

## 2013-10-25 NOTE — H&P (Signed)
Shelley Mckinney is a 23 y.o. female [id number] at 38+ with ROM.  Clear fluid at 3:45 am.  Contractions have increased since.  +FM, no VB.  Relatively uncomplicated PNC except recurrent yeast vaginitis and S<D, with normal growth on [place].  Also late entry to care,about 17 weeks. H/O PIH in G5.     Maternal Medical History:  Reason for admission: Rupture of membranes.   Contractions: Frequency: regular.   Perceived severity is moderate.    Fetal activity: Perceived fetal activity is normal.    Prenatal Complications - Diabetes: none.    OB History   Grav Para Term Preterm Abortions TAB SAB Ect Mult Living   TAB SVD 38wk, female SVD 42wk, female G6 present  No abn pap H/o GC/Chl   Past Medical History  Diagnosis Date  . Strep throat   . Chlamydia   . Infection     PARTNER + CHLAMYDIA  . Infection     FREQUENT YEAST  . Infection     BV  . NSVD (normal spontaneous vaginal delivery) 08/24/2012    2311  . Pregnancy induced hypertension 2014    post partum HTN  . Anemia 2014    during pregnancy  . ROM (rupture of membranes), premature 10/25/2013   Past Surgical History  Procedure Laterality Date  . Therapeutic abortion      x3   Family History: family history includes Cancer in her paternal grandfather and paternal uncle; Diabetes in her paternal grandmother and sister; Down syndrome in her cousin and maternal uncle; Drug abuse in her father and mother. Social History:  reports that she has never smoked. She has never used smokeless tobacco. She reports that she does not drink alcohol or use illicit drugs.single Meds PNV All NKDA   Prenatal Transfer Tool  Maternal Diabetes: No Genetic Screening: Normal Maternal Ultrasounds/Referrals: Normal Fetal Ultrasounds or other Referrals:  None Maternal Substance Abuse:  No Significant Maternal Medications:  None Significant Maternal Lab Results:  Lab values include: Group B Strep positive Other Comments:   None  Review of Systems  Constitutional: Negative.   HENT: Negative.   Eyes: Negative.   Respiratory: Negative.   Cardiovascular: Negative.   Gastrointestinal: Negative.   Genitourinary: Negative.   Musculoskeletal: Negative.   Skin: Negative.   Neurological: Negative.   Psychiatric/Behavioral: Negative.     Dilation: 3 Effacement (%): 70 Station: -2 Exam by:: JDaleyRN Blood pressure 109/66, pulse 102, temperature 98.3 F (36.8 C), temperature source Oral, resp. rate 18, height  (1.473 m), weight 58.06 kg (128 lb), last menstrual period 03/12/2013, not currently breastfeeding. Maternal Exam:  Uterine Assessment: Contraction strength is moderate.  Contraction frequency is regular.   Abdomen: Fundal height is S<D, AGA by [place].   Estimated fetal weight is 6-7#.   Fetal presentation: vertex  Introitus: Normal vulva. Normal vagina.  Pelvis: adequate for delivery.      Physical Exam  Constitutional: She is oriented to person, place, and time. She appears well-developed and well-nourished.  HENT:  Head: Normocephalic and atraumatic.  Cardiovascular: Normal rate and regular rhythm.   Respiratory: Effort normal and breath sounds normal. No respiratory distress. She has no wheezes.  GI: Soft. Bowel sounds are normal. She exhibits no distension. There is no tenderness.  Musculoskeletal: Normal range of motion.  Neurological: She is alert and oriented to person, place, and time.  Skin: Skin is warm and dry.  Psychiatric: She has a normal mood and affect. Her behavior is normal.    Prenatal labs: ABO, Rh: B/Negative/-- (04/08 0000) Antibody: Negative (04/08 0000) Rubella: Immune (04/08 0000) RPR: Nonreactive (04/08 0000)  HBsAg: Negative (04/08 0000)  HIV: Non-reactive (04/08 0000)  GBS:   positive  Hgb 11.1/UR Cx neg/GC neg/ Chl neg/Pap WNL/ glucola 107/Plt 163K/VNI  Rhogam 08/08/13 Tdap 08/08/13 EDC 10/31/13 - by LMP c/w 2nd tri [place] nl anat, post plac, female S<D -  AGA, good growth, nl AFI, vtx, Lt wall plac  Assessment/Plan: 22yo [id number] at 38+ with ROM gbbs + prophylaxis with PCN Pitocin to augment prn Epidural prn  Bovard-Stuckert, Shelley Mckinney 10/25/2013, 5:52 AM

## 2013-10-25 NOTE — Anesthesia Preprocedure Evaluation (Addendum)
Anesthesia Evaluation  Patient identified by MRN, date of birth, ID band Patient awake    Reviewed: Allergy & Precautions, H&P , Patient's Chart, lab work & pertinent test results  Airway Mallampati: II  TM Distance: >3 FB Neck ROM: full    Dental  (+) Teeth Intact   Pulmonary  breath sounds clear to auscultation        Cardiovascular hypertension, Rhythm:regular Rate:Normal     Neuro/Psych    GI/Hepatic   Endo/Other    Renal/GU      Musculoskeletal   Abdominal   Peds  Hematology  (+) anemia ,   Anesthesia Other Findings       Reproductive/Obstetrics (+) Pregnancy                             Anesthesia Physical Anesthesia Plan  ASA: II  Anesthesia Plan: Epidural   Post-op Pain Management:    Induction:   Airway Management Planned:   Additional Equipment:   Intra-op Plan:   Post-operative Plan:   Informed Consent: I have reviewed the patients History and Physical, chart, labs and discussed the procedure including the risks, benefits and alternatives for the proposed anesthesia with the patient or authorized representative who has indicated his/her understanding and acceptance.   Dental Advisory Given  Plan Discussed with:   Anesthesia Plan Comments: (Labs checked- platelets confirmed with RN in room. Fetal heart tracing, per RN, reported to be stable enough for sitting procedure. Discussed epidural, and patient consents to the procedure:  included risk of possible headache,backache, failed block, allergic reaction, and nerve injury. This patient was asked if she had any questions or concerns before the procedure started.)        Anesthesia Quick Evaluation  

## 2013-10-26 ENCOUNTER — Inpatient Hospital Stay (HOSPITAL_COMMUNITY): Admission: RE | Admit: 2013-10-26 | Payer: Medicaid Other | Source: Ambulatory Visit

## 2013-10-26 LAB — CBC
HCT: 27.7 % — ABNORMAL LOW (ref 36.0–46.0)
Hemoglobin: 8.8 g/dL — ABNORMAL LOW (ref 12.0–15.0)
MCH: 23.1 pg — ABNORMAL LOW (ref 26.0–34.0)
MCHC: 31.8 g/dL (ref 30.0–36.0)
MCV: 72.7 fL — ABNORMAL LOW (ref 78.0–100.0)
PLATELETS: 143 10*3/uL — AB (ref 150–400)
RBC: 3.81 MIL/uL — AB (ref 3.87–5.11)
RDW: 15.7 % — AB (ref 11.5–15.5)
WBC: 9.1 10*3/uL (ref 4.0–10.5)

## 2013-10-26 MED ORDER — RHO D IMMUNE GLOBULIN 1500 UNIT/2ML IJ SOSY
300.0000 ug | PREFILLED_SYRINGE | Freq: Once | INTRAMUSCULAR | Status: AC
  Administered 2013-10-26: 300 ug via INTRAMUSCULAR
  Filled 2013-10-26: qty 2

## 2013-10-26 NOTE — Progress Notes (Signed)
Post Partum Day 1 Subjective: no complaints and tolerating PO  Objective: Blood pressure 106/59, pulse 96, temperature 98.4 F (36.9 C), temperature source Oral, resp. rate 18, height  (1.473 m), weight 58.06 kg (128 lb), last menstrual period 03/12/2013, SpO2 100.00%, unknown if currently breastfeeding.  Physical Exam:  General: alert and cooperative Lochia: appropriate Uterine Fundus: firm    Recent Labs  10/25/13 0450  HGB 9.8*  HCT 30.7*    Assessment/Plan: Plan for discharge tomorrow   LOS: 1 day   Shelley Mckinney W 10/26/2013, 12:43 AM

## 2013-10-26 NOTE — Lactation Note (Signed)
This note was copied from the chart of Shelley Mckinney. Lactation Consultation Note  Patient Name: Shelley Mckinney [id number] Date: 10/26/2013 Reason for consult: Other (Comment) (charting for exclusion)   Maternal Data Formula Feeding for Exclusion: Yes Reason for exclusion: Mother's choice to formula feed on admision  Feeding    LATCH Score/Interventions                      Lactation Tools Discussed/Used     Consult Status Consult Status: Complete    Lynda Rainwater 10/26/2013, 3:33 PM

## 2013-10-26 NOTE — Progress Notes (Signed)
CSW acknowledges consult for [facility].     Referral screened out by Clinical Social Worker because MOB iniatiated prenatal care at 17 weeks.  Per policy, MOB does not meet criteria for referral for [facility] as policy defines [facility] as care initiated after 28 weeks.   Please contact the Clinical Social Worker if needs arise or per patient request.

## 2013-10-26 NOTE — Progress Notes (Signed)
UR chart review completed.  

## 2013-10-27 LAB — RH IG WORKUP (INCLUDES ABO/RH)
ABO/RH(D): B NEG
ANTIBODY SCREEN: NEGATIVE
FETAL SCREEN: NEGATIVE
Gestational Age(Wks): 38.1
Unit division: 0

## 2013-10-27 MED ORDER — IBUPROFEN 600 MG PO TABS: 600.0000 mg | ORAL_TABLET | Freq: Four times a day (QID) | ORAL | Status: DC

## 2013-10-27 NOTE — Discharge Summary (Signed)
Obstetric Discharge Summary Reason for Admission: rupture of membranes Prenatal Procedures: none Intrapartum Procedures: spontaneous vaginal delivery Postpartum Procedures: none Complications-Operative and Postpartum: none Hemoglobin  Date Value Ref Range Status  10/26/2013 8.8* 12.0 - 15.0 g/dL Final     HCT  Date Value Ref Range Status  10/26/2013 27.7* 36.0 - 46.0 % Final    Physical Exam:  General: alert and cooperative Lochia: appropriate Uterine Fundus: firm   Discharge Diagnoses: Term Pregnancy-delivered  Discharge Information: Date: 10/27/2013 Activity: pelvic rest Diet: routine Medications: Ibuprofen Condition: improved Instructions: refer to practice specific booklet Discharge to: home Follow-up Information   Follow up On 12/01/2013. (Time: 9:45 With Dr. Senaida Ores)       Follow up with Oliver Pila, MD.   Specialty:  Obstetrics and Gynecology   Contact information:   [address] [place] [address] [phone]       Newborn Data: Live born female  Birth Weight: 6 lb 5.8 oz (2885 g) APGAR: 9, 9  Home with mother.  Oliver Pila 10/27/2013, 9:02 AM

## 2013-10-27 NOTE — Progress Notes (Signed)
Post Partum Day 1 Subjective: no complaints and tolerating PO  Objective: Blood pressure 103/48, pulse 91, temperature 98.4 F (36.9 C), temperature source Oral, resp. rate 18, height  (1.473 m), weight 58.06 kg (128 lb), last menstrual period 03/12/2013, SpO2 100.00%, unknown if currently breastfeeding.  Physical Exam:  General: alert and cooperative Lochia: appropriate Uterine Fundus: firm    Recent Labs  10/25/13 0450 10/26/13 0620  HGB 9.8* 8.8*  HCT 30.7* 27.7*    Assessment/Plan: Discharge home   LOS: 2 days   Shelley Mckinney W 10/27/2013, 9:02 AM

## 2014-01-01 ENCOUNTER — Encounter (HOSPITAL_COMMUNITY): Payer: Self-pay | Admitting: Obstetrics and Gynecology

## 2014-02-06 ENCOUNTER — Inpatient Hospital Stay (HOSPITAL_COMMUNITY)
Admission: AD | Admit: 2014-02-06 | Discharge: 2014-02-06 | Disposition: A | Discharge status: Home / Self Care | Payer: Medicaid Other | Source: Ambulatory Visit | Attending: Obstetrics and Gynecology | Admitting: Obstetrics and Gynecology

## 2014-02-06 ENCOUNTER — Encounter (HOSPITAL_COMMUNITY): Payer: Self-pay | Admitting: *Deleted

## 2014-02-06 DIAGNOSIS — N390 Urinary tract infection, site not specified: Secondary | ICD-10-CM | POA: Diagnosis not present

## 2014-02-06 DIAGNOSIS — R300 Dysuria: Secondary | ICD-10-CM | POA: Diagnosis present

## 2014-02-06 LAB — URINALYSIS, ROUTINE W REFLEX MICROSCOPIC
Bilirubin Urine: NEGATIVE
Glucose, UA: NEGATIVE mg/dL
HGB URINE DIPSTICK: NEGATIVE
Ketones, ur: NEGATIVE mg/dL
NITRITE: NEGATIVE
Protein, ur: NEGATIVE mg/dL
Specific Gravity, Urine: 1.02 (ref 1.005–1.030)
UROBILINOGEN UA: 0.2 mg/dL (ref 0.0–1.0)
pH: 7.5 (ref 5.0–8.0)

## 2014-02-06 LAB — WET PREP, GENITAL
TRICH WET PREP: NONE SEEN
YEAST WET PREP: NONE SEEN

## 2014-02-06 LAB — URINE MICROSCOPIC-ADD ON

## 2014-02-06 LAB — POCT PREGNANCY, URINE: PREG TEST UR: NEGATIVE

## 2014-02-06 MED ORDER — CIPROFLOXACIN HCL 500 MG PO TABS: 500.0000 mg | ORAL_TABLET | Freq: Two times a day (BID) | ORAL | Status: DC

## 2014-02-06 MED ORDER — PHENAZOPYRIDINE HCL 100 MG PO TABS: 100.0000 mg | ORAL_TABLET | Freq: Three times a day (TID) | ORAL | Status: DC | PRN

## 2014-02-06 NOTE — Discharge Instructions (Signed)

## 2014-02-06 NOTE — Progress Notes (Signed)
Pt only feel pain when she passes urine

## 2014-02-06 NOTE — MAU Note (Signed)
Pt states she  Started having discomfort when she passes urine yestserday.

## 2014-02-06 NOTE — MAU Note (Signed)
Pain with urination. Started yesterday.  Also having frequency.

## 2014-02-06 NOTE — MAU Provider Note (Signed)
CC: Dysuria    HPI Shelley Mckinney is a 23 y.o. [phone] Unknown who presents with onset yesterday of dysuria and urinary frequency. Denies urgency or hematuria. No fever, N/V, back pain. No irritative vaginitis sx. Would like STI testing. Happy with Nexplanon.    Past Medical History  Diagnosis Date  . Strep throat   . Chlamydia   . Infection     PARTNER + CHLAMYDIA  . Infection     FREQUENT YEAST  . Infection     BV  . NSVD (normal spontaneous vaginal delivery) 08/24/2012    2311  . Pregnancy induced hypertension 2014    post partum HTN  . Anemia 2014    during pregnancy  . ROM (rupture of membranes), premature 10/25/2013    OB History  Gravida Para Term Preterm AB SAB TAB Ectopic Multiple Living  6 3 3  3  3   3     # Outcome Date GA Lbr Len/2nd Weight Sex Delivery Anes PTL Lv  6 Term 10/25/13 445w1d 06:20 / 00:17 2.885 kg (6 lb 5.8 oz) F Vag-Spont EPI  Y  5 Term 08/24/12 7891w3d / 00:09 3.033 kg (6 lb 11 oz) M Vag-Spont EPI  Y     Comments: anemia, htn after pregnancy  4 TAB 2012 5838w0d         3 TAB 2012 238w0d         2 TAB 2011 1638w0d         1 Term 01/2007 2291w3d 14:00 2.807 kg (6 lb 3 oz) M Vag-Spont EPI N Y     Comments: NO COMP; RHOGm      Past Surgical History  Procedure Laterality Date  . Therapeutic abortion      x3    History   Social History  . Marital Status: Single    Spouse Name: N/A    Number of Children: N/A  . Years of Education: 13   Occupational History  . CREW Unemployed   Social History Main Topics  . Smoking status: Never Smoker   . Smokeless tobacco: Never Used  . Alcohol Use: No     Comment: RARELY  . Drug Use: No  . Sexual Activity:    Partners: Male    Birth Control/ Protection: None   Other Topics Concern  . Not on file   Social History Narrative    No current facility-administered medications on file prior to encounter.   Current Outpatient Prescriptions on File Prior to Encounter  Medication Sig Dispense Refill  .  ibuprofen (ADVIL,MOTRIN) 600 MG tablet Take 1 tablet (600 mg total) by mouth every 6 (six) hours. 30 tablet 0  . Prenatal Vit-Fe Fumarate-FA (PRENATAL MULTIVITAMIN) TABS tablet Take 1 tablet by mouth daily at 12 noon.      No Known Allergies  ROS Pertinent items in HPI  PHYSICAL EXAM Filed Vitals:   02/06/14 1725  BP: 103/66  Pulse: 85  Temp: 98.5 F (36.9 C)  Resp: 16   General: Well nourished, well developed female in no acute distress Cardiovascular: Normal rate Respiratory: Normal effort Abdomen: Soft, nontender Back: No CVAT Extremities: No edema Neurologic: Alert and oriented Speculum exam: NEFG; vagina with white homogenous discharge, no blood; cervix clean Bimanual exam: cervix closed, no CMT; uterus NSSP; no adnexal tenderness or masses   LAB RESULTS Results for orders placed or performed during the hospital encounter of 02/06/14 (from the past 24 hour(s))  Urinalysis, Routine w reflex microscopic  Status: Abnormal   Collection Time: 02/06/14  4:46 PM  Result Value Ref Range   Color, Urine YELLOW YELLOW   APPearance CLEAR CLEAR   Specific Gravity, Urine 1.020 1.005 - 1.030   pH 7.5 5.0 - 8.0   Glucose, UA NEGATIVE NEGATIVE mg/dL   Hgb urine dipstick NEGATIVE NEGATIVE   Bilirubin Urine NEGATIVE NEGATIVE   Ketones, ur NEGATIVE NEGATIVE mg/dL   Protein, ur NEGATIVE NEGATIVE mg/dL   Urobilinogen, UA 0.2 0.0 - 1.0 mg/dL   Nitrite NEGATIVE NEGATIVE   Leukocytes, UA TRACE (A) NEGATIVE  Urine microscopic-add on     Status: Abnormal   Collection Time: 02/06/14  4:46 PM  Result Value Ref Range   Squamous Epithelial / LPF FEW (A) RARE   WBC, UA 11-20 <3 WBC/hpf   RBC / HPF 0-2 <3 RBC/hpf   Bacteria, UA MANY (A) RARE   Urine-Other MUCOUS PRESENT   Pregnancy, urine POC     Status: None   Collection Time: 02/06/14  4:51 PM  Result Value Ref Range   Preg Test, Ur NEGATIVE NEGATIVE  Wet prep, genital     Status: Abnormal   Collection Time: 02/06/14  5:40 PM   Result Value Ref Range   Yeast Wet Prep HPF POC NONE SEEN NONE SEEN   Trich, Wet Prep NONE SEEN NONE SEEN   Clue Cells Wet Prep HPF POC FEW (A) NONE SEEN   WBC, Wet Prep HPF POC FEW (A) NONE SEEN    IMAGING No results found.  MAU COURSE GC/CT sent  ASSESSMENT  1. UTI (lower urinary tract infection)     PLAN Discharge home. See AVS for patient education.    Medication List    STOP taking these medications        prenatal multivitamin Tabs tablet      TAKE these medications        ciprofloxacin 500 MG tablet  Commonly known as:  CIPRO  Take 1 tablet (500 mg total) by mouth 2 (two) times daily.     ibuprofen 600 MG tablet  Commonly known as:  ADVIL,MOTRIN  Take 1 tablet (600 mg total) by mouth every 6 (six) hours.     phenazopyridine 100 MG tablet  Commonly known as:  PYRIDIUM  Take 1 tablet (100 mg total) by mouth 3 (three) times daily as needed for pain.          Danae Orleanseirdre C Bobbijo Holst, CNM 02/06/2014 5:44 PM

## 2014-02-07 LAB — GC/CHLAMYDIA PROBE AMP
CT Probe RNA: NEGATIVE
GC Probe RNA: NEGATIVE

## 2014-05-15 ENCOUNTER — Encounter (HOSPITAL_COMMUNITY): Payer: Self-pay | Admitting: *Deleted

## 2014-05-15 ENCOUNTER — Inpatient Hospital Stay (HOSPITAL_COMMUNITY)
Admission: AD | Admit: 2014-05-15 | Discharge: 2014-05-15 | Disposition: A | Discharge status: Home / Self Care | Payer: Medicaid Other | Source: Ambulatory Visit | Attending: Obstetrics & Gynecology | Admitting: Obstetrics & Gynecology

## 2014-05-15 DIAGNOSIS — Z3202 Encounter for pregnancy test, result negative: Secondary | ICD-10-CM | POA: Insufficient documentation

## 2014-05-15 DIAGNOSIS — N3001 Acute cystitis with hematuria: Secondary | ICD-10-CM | POA: Diagnosis not present

## 2014-05-15 DIAGNOSIS — N390 Urinary tract infection, site not specified: Secondary | ICD-10-CM | POA: Insufficient documentation

## 2014-05-15 LAB — URINALYSIS, ROUTINE W REFLEX MICROSCOPIC
Bilirubin Urine: NEGATIVE
GLUCOSE, UA: NEGATIVE mg/dL
Ketones, ur: NEGATIVE mg/dL
NITRITE: NEGATIVE
Protein, ur: 30 mg/dL — AB
SPECIFIC GRAVITY, URINE: 1.02 (ref 1.005–1.030)
Urobilinogen, UA: 0.2 mg/dL (ref 0.0–1.0)
pH: 7 (ref 5.0–8.0)

## 2014-05-15 LAB — POCT PREGNANCY, URINE: PREG TEST UR: NEGATIVE

## 2014-05-15 LAB — URINE MICROSCOPIC-ADD ON

## 2014-05-15 MED ORDER — PHENAZOPYRIDINE HCL 100 MG PO TABS: 100.0000 mg | ORAL_TABLET | Freq: Three times a day (TID) | ORAL | Status: DC | PRN

## 2014-05-15 MED ORDER — NITROFURANTOIN MONOHYD MACRO 100 MG PO CAPS: 100.0000 mg | ORAL_CAPSULE | Freq: Two times a day (BID) | ORAL | Status: DC

## 2014-05-15 NOTE — Discharge Instructions (Signed)

## 2014-05-15 NOTE — MAU Note (Signed)
Dysuria x 2 days, has noticed blood in urine.  Denies back pain or fever.

## 2014-05-15 NOTE — Progress Notes (Signed)
Chief Complaint: Dysuria   First Provider Initiated Contact with Patient 05/15/14 1450     SUBJECTIVE HPI: Shelley Mckinney is a 24 y.o. [id number] who presents with a 2 day history of dysuria and urinary frequency. She says the dysuria has gotten worse over the last 2 days. She also endorses flank pain, hematuria and states that her menstruation has been irregular since starting Nexplanon. She denies vaginal discharge, fever, nausea, vomiting, diarrhea, vaginal irritation, itching. She has not taken any medication.   Past Medical History  Diagnosis Date  . Strep throat   . Chlamydia   . Infection     PARTNER + CHLAMYDIA  . Infection     FREQUENT YEAST  . Infection     BV  . NSVD (normal spontaneous vaginal delivery) 08/24/2012    2311  . Pregnancy induced hypertension 2014    post partum HTN  . Anemia 2014    during pregnancy  . ROM (rupture of membranes), premature 10/25/2013   OB History  Gravida Para Term Preterm AB SAB TAB Ectopic Multiple Living  # Outcome Date GA Lbr Len/2nd Weight Sex Delivery Anes PTL Lv  6 Term 10/25/13 [redacted]w[redacted]d 06:20 / 00:17 2.885 kg (6 lb 5.8 oz) F Vag-Spont EPI  Y  5 Term 08/24/12 [redacted]w[redacted]d / 00:09 3.033 kg (6 lb 11 oz) M Vag-Spont EPI  Y     Comments: anemia, htn after pregnancy  4 TAB 2012 [redacted]w[redacted]d         3 TAB 2012 [redacted]w[redacted]d         2 TAB 2011 [redacted]w[redacted]d         1 Term 01/2007 [redacted]w[redacted]d 14:00 2.807 kg (6 lb 3 oz) M Vag-Spont EPI N Y     Comments: NO COMP; RHOGm     Past Surgical History  Procedure Laterality Date  . Therapeutic abortion      x3   History   Social History  . Marital Status: Single    Spouse Name: N/A  . Number of Children: N/A  . Years of Education: 13   Occupational History  . CREW Unemployed   Social History Main Topics  . Smoking status: Never Smoker   . Smokeless tobacco: Never Used  . Alcohol Use: No     Comment: RARELY  . Drug Use: No  . Sexual Activity:    Partners: Male    Birth Control/ Protection: Implant    Other Topics Concern  . Not on file   Social History Narrative   No current facility-administered medications on file prior to encounter.   Current Outpatient Prescriptions on File Prior to Encounter  Medication Sig Dispense Refill  . ciprofloxacin (CIPRO) 500 MG tablet Take 1 tablet (500 mg total) by mouth 2 (two) times daily. (Patient not taking: Reported on 05/15/2014) 6 tablet 0  . ibuprofen (ADVIL,MOTRIN) 600 MG tablet Take 1 tablet (600 mg total) by mouth every 6 (six) hours. (Patient not taking: Reported on 05/15/2014) 30 tablet 0   No Known Allergies  ROS: Positive for: dysuria, urinary frequency, hematuria, flank pain. Negative for: vaginal discharge, fever, nausea, vomiting, diarrhea, vaginal irritation, itching.  OBJECTIVE Blood pressure 109/60, pulse 95, temperature 98.7 F (37.1 C), temperature source Oral, resp. rate 18, height  (1.448 m), weight 49.986 kg (110 lb 3.2 oz), unknown if currently breastfeeding. GENERAL: Well-developed, well-nourished female in no acute distress.  HEENT: Normocephalic HEART: normal rate  RESP: normal effort ABDOMEN: Soft, non-tender EXTREMITIES: Nontender, no edema NEURO: Alert and oriented   LAB RESULTS Results for orders placed or performed during the hospital encounter of 05/15/14 (from the past 24 hour(s))  Urinalysis, Routine w reflex microscopic     Status: Abnormal   Collection Time: 05/15/14  1:41 PM  Result Value Ref Range   Color, Urine YELLOW YELLOW   APPearance CLOUDY (A) CLEAR   Specific Gravity, Urine 1.020 1.005 - 1.030   pH 7.0 5.0 - 8.0   Glucose, UA NEGATIVE NEGATIVE mg/dL   Hgb urine dipstick MODERATE (A) NEGATIVE   Bilirubin Urine NEGATIVE NEGATIVE   Ketones, ur NEGATIVE NEGATIVE mg/dL   Protein, ur 30 (A) NEGATIVE mg/dL   Urobilinogen, UA 0.2 0.0 - 1.0 mg/dL   Nitrite NEGATIVE NEGATIVE   Leukocytes, UA MODERATE (A) NEGATIVE  Urine microscopic-add on     Status: None   Collection Time: 05/15/14  1:41  PM  Result Value Ref Range   WBC, UA TOO NUMEROUS TO COUNT <3 WBC/hpf   RBC / HPF 3-6 <3 RBC/hpf   Bacteria, UA RARE RARE   Urine-Other MUCOUS PRESENT   Pregnancy, urine POC     Status: None   Collection Time: 05/15/14  2:19 PM  Result Value Ref Range   Preg Test, Ur NEGATIVE NEGATIVE    MAU COURSE - Preg Test: negative - UA: moderate Hgb, moderate leukocytes, numerous WBC, 3-6 RBCs  ASSESSMENT 1. Acute cystitis with hematuria     PLAN Discharge home on short course Macrobid. Return if symptoms do not resolve or worsen after antibiotic course.      Follow-up Information    [facility]   Why:  As needed   Contact information:   [address] [id number] [place] [id number] [phone]       Medication List    STOP taking these medications        ciprofloxacin 500 MG tablet  Commonly known as:  CIPRO     ibuprofen 600 MG tablet  Commonly known as:  ADVIL,MOTRIN      TAKE these medications        nitrofurantoin (macrocrystal-monohydrate) 100 MG capsule  Commonly known as:  MACROBID  Take 1 capsule (100 mg total) by mouth 2 (two) times daily.     phenazopyridine 100 MG tablet  Commonly known as:  PYRIDIUM  Take 1 tablet (100 mg total) by mouth 3 (three) times daily as needed for pain.         Lillia MountainNicholas Willette Mudry, Med Student 05/15/2014  3:30 PM

## 2014-05-15 NOTE — MAU Provider Note (Signed)
History     CSN: [id number]  Arrival date and time: 05/15/14 1308   First Provider Initiated Contact with Patient 05/15/14 1450      Chief Complaint  Patient presents with  . Dysuria   HPI  [name] is a 24 y.o. [phone] who presents to MAU today with complaint of dysuria and urinary frequency x 2 days. She feels symptoms are worsening. She is unsure about hematuria as it may be vaginal spotting that is common for her with Nexplanon. She denies N/V/D, vaginal discharge, fever or flank pain. She states mild lower abdominal cramping.   OB History    Gravida Para Term Preterm AB TAB SAB Ectopic Multiple Living   6 3 3  3 3    3       Past Medical History  Diagnosis Date  . Strep throat   . Chlamydia   . Infection     PARTNER + CHLAMYDIA  . Infection     FREQUENT YEAST  . Infection     BV  . NSVD (normal spontaneous vaginal delivery) 08/24/2012    2311  . Pregnancy induced hypertension 2014    post partum HTN  . Anemia 2014    during pregnancy  . ROM (rupture of membranes), premature 10/25/2013    Past Surgical History  Procedure Laterality Date  . Therapeutic abortion      x3    Family History  Problem Relation Age of Onset  . Drug abuse Mother   . Drug abuse Father   . Diabetes Sister   . Down syndrome Maternal Uncle   . Cancer Paternal Uncle   . Diabetes Paternal Grandmother   . Cancer Paternal Grandfather     COLON  . Down syndrome Cousin     PATERNAL    History  Substance Use Topics  . Smoking status: Never Smoker   . Smokeless tobacco: Never Used  . Alcohol Use: No     Comment: RARELY    Allergies: No Known Allergies  Prescriptions prior to admission  Medication Sig Dispense Refill Last Dose  . ciprofloxacin (CIPRO) 500 MG tablet Take 1 tablet (500 mg total) by mouth 2 (two) times daily. (Patient not taking: Reported on 05/15/2014) 6 tablet 0   . ibuprofen (ADVIL,MOTRIN) 600 MG tablet Take 1 tablet (600 mg total) by mouth every 6 (six)  hours. (Patient not taking: Reported on 05/15/2014) 30 tablet 0   . [DISCONTINUED] phenazopyridine (PYRIDIUM) 100 MG tablet Take 1 tablet (100 mg total) by mouth 3 (three) times daily as needed for pain. (Patient not taking: Reported on 05/15/2014) 10 tablet 0     Review of Systems  Constitutional: Negative for fever and malaise/fatigue.  Gastrointestinal: Negative for nausea, vomiting, abdominal pain, diarrhea and constipation.  Genitourinary: Positive for dysuria and frequency. Negative for urgency, hematuria and flank pain.       + vaginal bleeding Neg - vaginal discharge   Physical Exam   Blood pressure 109/60, pulse 95, temperature 98.7 F (37.1 C), temperature source Oral, resp. rate 18, height 4\' 9"  (1.448 m), weight 110 lb 3.2 oz (49.986 kg), unknown if currently breastfeeding.  Physical Exam  Constitutional: She is oriented to person, place, and time. She appears well-developed and well-nourished. No distress.  HENT:  Head: Normocephalic.  Cardiovascular: Normal rate.   Respiratory: Effort normal.  GI: Soft. She exhibits no distension and no mass. There is no tenderness. There is no rebound, no guarding and no CVA tenderness.  Neurological: She is alert and oriented to person, place, and time.  Skin: Skin is warm and dry. No erythema.  Psychiatric: She has a normal mood and affect.   Results for orders placed or performed during the hospital encounter of 05/15/14 (from the past 24 hour(s))  Urinalysis, Routine w reflex microscopic     Status: Abnormal   Collection Time: 05/15/14  1:41 PM  Result Value Ref Range   Color, Urine YELLOW YELLOW   APPearance CLOUDY (A) CLEAR   Specific Gravity, Urine 1.020 1.005 - 1.030   pH 7.0 5.0 - 8.0   Glucose, UA NEGATIVE NEGATIVE mg/dL   Hgb urine dipstick MODERATE (A) NEGATIVE   Bilirubin Urine NEGATIVE NEGATIVE   Ketones, ur NEGATIVE NEGATIVE mg/dL   Protein, ur 30 (A) NEGATIVE mg/dL   Urobilinogen, UA 0.2 0.0 - 1.0 mg/dL   Nitrite  NEGATIVE NEGATIVE   Leukocytes, UA MODERATE (A) NEGATIVE  Urine microscopic-add on     Status: None   Collection Time: 05/15/14  1:41 PM  Result Value Ref Range   WBC, UA TOO NUMEROUS TO COUNT <3 WBC/hpf   RBC / HPF 3-6 <3 RBC/hpf   Bacteria, UA RARE RARE   Urine-Other MUCOUS PRESENT   Pregnancy, urine POC     Status: None   Collection Time: 05/15/14  2:19 PM  Result Value Ref Range   Preg Test, Ur NEGATIVE NEGATIVE    MAU Course  Procedures None  MDM UPT -negative UA today Patient declines pelvic exam and infection testing Urine culture sent Assessment and Plan  A: UTI  P: Discharge home Rx for Macrobid and Pyridium sent to patient's pharmacy Advised increased PO hydration  Warning signs for pyelonephritis discussed Patient may return to MAU as needed or if her condition were to change or worsen   Marny Lowenstein, PA-C  05/15/2014, 3:28 PM

## 2014-05-17 LAB — URINE CULTURE: Colony Count: >=100000

## 2014-08-31 ENCOUNTER — Emergency Department (HOSPITAL_COMMUNITY): Payer: Self-pay

## 2014-08-31 ENCOUNTER — Encounter (HOSPITAL_COMMUNITY): Payer: Self-pay

## 2014-08-31 ENCOUNTER — Emergency Department (HOSPITAL_COMMUNITY)
Admission: EM | Admit: 2014-08-31 | Discharge: 2014-09-01 | Disposition: A | Discharge status: Home / Self Care | Payer: Self-pay | Attending: Emergency Medicine | Admitting: Emergency Medicine

## 2014-08-31 DIAGNOSIS — Z8619 Personal history of other infectious and parasitic diseases: Secondary | ICD-10-CM | POA: Insufficient documentation

## 2014-08-31 DIAGNOSIS — Z862 Personal history of diseases of the blood and blood-forming organs and certain disorders involving the immune mechanism: Secondary | ICD-10-CM | POA: Insufficient documentation

## 2014-08-31 DIAGNOSIS — Z79899 Other long term (current) drug therapy: Secondary | ICD-10-CM | POA: Insufficient documentation

## 2014-08-31 DIAGNOSIS — J020 Streptococcal pharyngitis: Secondary | ICD-10-CM | POA: Insufficient documentation

## 2014-08-31 LAB — CBC WITH DIFFERENTIAL/PLATELET
BASOS ABS: 0 10*3/uL (ref 0.0–0.1)
Basophils Relative: 0 % (ref 0–1)
EOS ABS: 0 10*3/uL (ref 0.0–0.7)
Eosinophils Relative: 0 % (ref 0–5)
HCT: 31.5 % — ABNORMAL LOW (ref 36.0–46.0)
Hemoglobin: 10.4 g/dL — ABNORMAL LOW (ref 12.0–15.0)
LYMPHS PCT: 5 % — AB (ref 12–46)
Lymphs Abs: 0.7 10*3/uL (ref 0.7–4.0)
MCH: 25.4 pg — AB (ref 26.0–34.0)
MCHC: 33 g/dL (ref 30.0–36.0)
MCV: 77 fL — AB (ref 78.0–100.0)
MONOS PCT: 6 % (ref 3–12)
Monocytes Absolute: 0.8 10*3/uL (ref 0.1–1.0)
Neutro Abs: 11.9 10*3/uL — ABNORMAL HIGH (ref 1.7–7.7)
Neutrophils Relative %: 89 % — ABNORMAL HIGH (ref 43–77)
Platelets: 161 10*3/uL (ref 150–400)
RBC: 4.09 MIL/uL (ref 3.87–5.11)
RDW: 14.1 % (ref 11.5–15.5)
WBC: 13.5 10*3/uL — ABNORMAL HIGH (ref 4.0–10.5)

## 2014-08-31 LAB — RAPID STREP SCREEN (MED CTR MEBANE ONLY): STREPTOCOCCUS, GROUP A SCREEN (DIRECT): POSITIVE — AB

## 2014-08-31 MED ORDER — MORPHINE SULFATE 2 MG/ML IJ SOLN: 2.0000 mg | Freq: Once | INTRAMUSCULAR | Status: DC

## 2014-08-31 MED ORDER — SODIUM CHLORIDE 0.9 % IV BOLUS (SEPSIS)
1000.0000 mL | Freq: Once | INTRAVENOUS | Status: AC
  Administered 2014-08-31: 1000 mL via INTRAVENOUS

## 2014-08-31 MED ORDER — IOHEXOL 300 MG/ML  SOLN
75.0000 mL | Freq: Once | INTRAMUSCULAR | Status: AC | PRN
  Administered 2014-08-31: 100 mL via INTRAVENOUS

## 2014-08-31 MED ORDER — PENICILLIN G BENZATHINE 1200000 UNIT/2ML IM SUSP
1.2000 10*6.[IU] | Freq: Once | INTRAMUSCULAR | Status: AC
  Administered 2014-08-31: 1.2 10*6.[IU] via INTRAMUSCULAR
  Filled 2014-08-31: qty 2

## 2014-08-31 MED ORDER — KETOROLAC TROMETHAMINE 30 MG/ML IJ SOLN
15.0000 mg | Freq: Once | INTRAMUSCULAR | Status: AC
  Administered 2014-08-31: 15 mg via INTRAVENOUS
  Filled 2014-08-31: qty 1

## 2014-08-31 MED ORDER — HYDROCODONE-ACETAMINOPHEN 5-325 MG PO TABS: 1.0000 | ORAL_TABLET | Freq: Four times a day (QID) | ORAL | Status: DC | PRN

## 2014-08-31 MED ORDER — ACETAMINOPHEN 500 MG PO TABS
1000.0000 mg | ORAL_TABLET | Freq: Once | ORAL | Status: AC
  Administered 2014-08-31: 1000 mg via ORAL
  Filled 2014-08-31: qty 2

## 2014-08-31 MED ORDER — KETOROLAC TROMETHAMINE 30 MG/ML IJ SOLN
30.0000 mg | Freq: Once | INTRAMUSCULAR | Status: AC
  Administered 2014-08-31: 30 mg via INTRAVENOUS
  Filled 2014-08-31: qty 1

## 2014-08-31 MED ORDER — IBUPROFEN 600 MG PO TABS: 600.0000 mg | ORAL_TABLET | Freq: Four times a day (QID) | ORAL | Status: DC | PRN

## 2014-08-31 NOTE — Discharge Instructions (Signed)
Return to the emergency room with worsening of symptoms, new symptoms or with symptoms that are concerning, especially difficulty swallowing, unable to tolerate fluids by mouth, and a letter mouth fully, high fevers not controlled with Tylenol or ibuprofen, Headache, neck pain. RICE: Rest, Ice (three cycles of 20 mins on, 20mins off at least twice a day), compression/brace, elevation. Heating pad works well for back pain. Ibuprofen 400mg  (2 tablets 200mg ) every 5-6 hours for 3-5 days. Norco for severe pain. Do not operate machinery, drive or drink alcohol while taking narcotics or muscle relaxers. Follow up with PCP if symptoms worsen or are persistent. Read below information and follow recommendations.  Pharyngitis Pharyngitis is redness, pain, and swelling (inflammation) of your pharynx.  CAUSES  Pharyngitis is usually caused by infection. Most of the time, these infections are from viruses (viral) and are part of a cold. However, sometimes pharyngitis is caused by bacteria (bacterial). Pharyngitis can also be caused by allergies. Viral pharyngitis may be spread from person to person by coughing, sneezing, and personal items or utensils (cups, forks, spoons, toothbrushes). Bacterial pharyngitis may be spread from person to person by more intimate contact, such as kissing.  SIGNS AND SYMPTOMS  Symptoms of pharyngitis include:   Sore throat.   Tiredness (fatigue).   Low-grade fever.   Headache.  Joint pain and muscle aches.  Skin rashes.  Swollen lymph nodes.  Plaque-like film on throat or tonsils (often seen with bacterial pharyngitis). DIAGNOSIS  Your health care provider will ask you questions about your illness and your symptoms. Your medical history, along with a physical exam, is often all that is needed to diagnose pharyngitis. Sometimes, a rapid strep test is done. Other lab tests may also be done, depending on the suspected cause.  TREATMENT  Viral pharyngitis will  usually get better in 3-4 days without the use of medicine. Bacterial pharyngitis is treated with medicines that kill germs (antibiotics).  HOME CARE INSTRUCTIONS   Drink enough water and fluids to keep your urine clear or pale yellow.   Only take over-the-counter or prescription medicines as directed by your health care provider:   If you are prescribed antibiotics, make sure you finish them even if you start to feel better.   Do not take aspirin.   Get lots of rest.   Gargle with 8 oz of salt water ( tsp of salt per 1 qt of water) as often as every 1-2 hours to soothe your throat.   Throat lozenges (if you are not at risk for choking) or sprays may be used to soothe your throat. SEEK MEDICAL CARE IF:   You have large, tender lumps in your neck.  You have a rash.  You cough up green, yellow-brown, or bloody spit. SEEK IMMEDIATE MEDICAL CARE IF:   Your neck becomes stiff.  You drool or are unable to swallow liquids.  You vomit or are unable to keep medicines or liquids down.  You have severe pain that does not go away with the use of recommended medicines.  You have trouble breathing (not caused by a stuffy nose). MAKE SURE YOU:   Understand these instructions.  Will watch your condition.  Will get help right away if you are not doing well or get worse. Document Released: 02/16/2005 Document Revised: 12/07/2012 Document Reviewed: 10/24/2012 [facility] Patient Information 2015 [place] [employer] [place]. This information is not intended to replace advice given to you by your health care provider. Make sure you discuss any questions you have with your  health care provider. ° ° °

## 2014-08-31 NOTE — ED Notes (Signed)
Pt complaining of sore throat x 1 week. Painful to swallow, fever of 101.6.

## 2014-08-31 NOTE — ED Provider Notes (Signed)
CSN: [id number]     Arrival date & time 08/31/14  1657 History   This chart was scribed for Oswaldo ConroyVictoria Elisheba Mcdonnell, PA-C working with Jerelyn ScottMartha Linker, MD by Elveria Risingimelie Horne, ED Scribe. This patient was seen in room TR03C/TR03C and the patient's care was started at 5:29 PM.   Chief Complaint  Patient presents with  . Sore Throat   The history is provided by the patient. No language interpreter was used.   HPI Comments: Shelley Mckinney is a 24 y.o. female who presents to the Emergency Department complaining of worsening swollen, sore throat ongoing for four days now. Patient reports increased pain with swallowing. Patient reports treatment with Tylenol, Nyquil, and Alka Seltzer, without relief. Patient denies additional symptoms including cough, congestion, drooling, trismus, shortness of breath, chest pain, nausea, or vomiting. ED vitals reveal temperature of 101.25F; patient states she was unaware of her fever.     Past Medical History  Diagnosis Date  . Strep throat   . Chlamydia   . Infection     PARTNER + CHLAMYDIA  . Infection     FREQUENT YEAST  . Infection     BV  . NSVD (normal spontaneous vaginal delivery) 08/24/2012    2311  . Pregnancy induced hypertension 2014    post partum HTN  . Anemia 2014    during pregnancy  . ROM (rupture of membranes), premature 10/25/2013   Past Surgical History  Procedure Laterality Date  . Therapeutic abortion      x3   Family History  Problem Relation Age of Onset  . Drug abuse Mother   . Drug abuse Father   . Diabetes Sister   . Down syndrome Maternal Uncle   . Cancer Paternal Uncle   . Diabetes Paternal Grandmother   . Cancer Paternal Grandfather     COLON  . Down syndrome Cousin     PATERNAL   History  Substance Use Topics  . Smoking status: Never Smoker   . Smokeless tobacco: Never Used  . Alcohol Use: No     Comment: RARELY   OB History    Gravida Para Term Preterm AB TAB SAB Ectopic Multiple Living   6 3 3  3 3    3      Review  of Systems  Constitutional: Positive for fever.  HENT: Positive for sore throat. Negative for congestion, trouble swallowing and voice change.   Respiratory: Negative for cough and shortness of breath.   Cardiovascular: Negative for chest pain.  Gastrointestinal: Negative for nausea and vomiting.      Allergies  Review of patient's allergies indicates no known allergies.  Home Medications   Prior to Admission medications   Medication Sig Start Date End Date Taking? Authorizing Provider  HYDROcodone-acetaminophen (NORCO/VICODIN) 5-325 MG per tablet Take 1 tablet by mouth every 6 (six) hours as needed. 08/31/14   Oswaldo ConroyVictoria Elnor Renovato, PA-C  ibuprofen (ADVIL,MOTRIN) 600 MG tablet Take 1 tablet (600 mg total) by mouth every 6 (six) hours as needed. 08/31/14   Oswaldo ConroyVictoria Kaede Clendenen, PA-C  nitrofurantoin, macrocrystal-monohydrate, (MACROBID) 100 MG capsule Take 1 capsule (100 mg total) by mouth 2 (two) times daily. 05/15/14   Marny LowensteinJulie N Wenzel, PA-C  phenazopyridine (PYRIDIUM) 100 MG tablet Take 1 tablet (100 mg total) by mouth 3 (three) times daily as needed for pain. 05/15/14   Marny LowensteinJulie N Wenzel, PA-C   Triage Vitals: BP 118/62 mmHg  Pulse 106  Temp(Src) 101.6 F (38.7 C) (Oral)  Resp 18  Ht 4\' 9"  (  1.448 m)  Wt 110 lb (49.896 kg)  BMI 23.80 kg/m2  SpO2 100% Physical Exam  Constitutional: She appears well-developed and well-nourished. No distress.  HENT:  Head: Normocephalic and atraumatic.  Nose: Right sinus exhibits no maxillary sinus tenderness and no frontal sinus tenderness. Left sinus exhibits no maxillary sinus tenderness and no frontal sinus tenderness.  Mouth/Throat: Mucous membranes are normal. Oropharyngeal exudate, posterior oropharyngeal edema and posterior oropharyngeal erythema present.  No trismus or uvula deviation.  Eyes: Conjunctivae and EOM are normal. Right eye exhibits no discharge. Left eye exhibits no discharge.  Neck: Normal range of motion. Neck supple.  Cardiovascular: Normal  rate, regular rhythm and normal heart sounds.   Pulmonary/Chest: Effort normal and breath sounds normal. No respiratory distress. She has no wheezes. She has no rales.  Abdominal: Soft. Bowel sounds are normal. She exhibits no distension. There is no tenderness.  Lymphadenopathy:    She has cervical adenopathy.  Neurological: She is alert.  Skin: Skin is warm and dry. She is not diaphoretic.  Nursing note and vitals reviewed.   ED Course  Procedures (including critical care time)  COORDINATION OF CARE: 5:33 PM- Patient agreeable to IV fluids. Discussed treatment plan with patient at bedside and patient agreed to plan.   Labs Review Labs Reviewed  RAPID STREP SCREEN (NOT AT [facility]) - Abnormal; Notable for the following:    Streptococcus, Group A Screen (Direct) POSITIVE (*)    All other components within normal limits  CBC WITH DIFFERENTIAL/PLATELET - Abnormal; Notable for the following:    WBC 13.5 (*)    Hemoglobin 10.4 (*)    HCT 31.5 (*)    MCV 77.0 (*)    MCH 25.4 (*)    Neutrophils Relative % 89 (*)    Neutro Abs 11.9 (*)    Lymphocytes Relative 5 (*)    All other components within normal limits  BASIC METABOLIC PANEL    Imaging Review Ct Soft Tissue Neck W Contrast  08/31/2014   CLINICAL DATA:  24 year old female with sore throat x1 week. Painful swallowing and fever.  EXAM: CT NECK WITH CONTRAST  TECHNIQUE: Multidetector CT imaging of the neck was performed using the standard protocol following the bolus administration of intravenous contrast.  CONTRAST:  OMNIPAQUE IOHEXOL 300 MG/ML  SOLN  COMPARISON:  None.  FINDINGS: Pharynx and larynx: There is minimal asymmetric prominence of the right tonsil with a focal ill-defined hypodense area with heterogeneous enhancement which may represent phlegmonous changes. No drainable fluid collection/ abscess identified. Clinical correlation is recommended.  Salivary glands: Unremarkable.  Thyroid: Unremarkable.  Lymph nodes: Bilateral  anterior cervical adenopathy, right greater than left. There is prominence of the adenoidal tissue.  Vascular: Unremarkable.  Limited intracranial: Unremarkable.  Visualized orbits: Unremarkable.  Mastoids and visualized paranasal sinuses: Clear.  Skeleton: Unremarkable.  No fracture.  Upper chest: Clear.  IMPRESSION: Mild right tonsillar prominence. No drainable fluid collection/ abscess identified at this time.  Bilateral cervical adenopathy.   Electronically Signed   By: Elgie Collard M.D.   On: 08/31/2014 23:46     EKG Interpretation None      Meds given in ED:  Medications  morphine 2 MG/ML injection 2 mg (2 mg Intravenous Not Given 08/31/14 1936)  sodium chloride 0.9 % bolus 1,000 mL (0 mLs Intravenous Stopped 08/31/14 1937)  ketorolac (TORADOL) 30 MG/ML injection 30 mg (30 mg Intravenous Given 08/31/14 1742)  penicillin g benzathine (BICILLIN LA) 1200000 UNIT/2ML injection 1.2 Million Units (1.2 Million  Units Intramuscular Given 08/31/14 1935)  sodium chloride 0.9 % bolus 1,000 mL (0 mLs Intravenous Stopped 08/31/14 2156)  acetaminophen (TYLENOL) tablet 1,000 mg (1,000 mg Oral Given 08/31/14 2154)  sodium chloride 0.9 % bolus 1,000 mL (1,000 mLs Intravenous New Bag/Given 08/31/14 2341)  ketorolac (TORADOL) 30 MG/ML injection 15 mg (15 mg Intravenous Given 08/31/14 2340)  iohexol (OMNIPAQUE) 300 MG/ML solution 75 mL (100 mLs Intravenous Contrast Given 08/31/14 2321)    New Prescriptions   HYDROCODONE-ACETAMINOPHEN (NORCO/VICODIN) 5-325 MG PER TABLET    Take 1 tablet by mouth every 6 (six) hours as needed.   IBUPROFEN (ADVIL,MOTRIN) 600 MG TABLET    Take 1 tablet (600 mg total) by mouth every 6 (six) hours as needed.      MDM   Final diagnoses:  Acute streptococcal pharyngitis   Pt febrile with tonsillar exudate, cervical lymphadenopathy, & dysphagia; diagnosis of strep. No trismus or uvula deviation. Treated in the Ed with NSAIDs and PCN IM.  Pt appears mildly dehydrated, discussed importance  of water rehydration. Patient given 2 L fluid as well as Toradol with improvement of her fevers have patient with persistent tachycardia. Greater swelling on left. Will obtain basic labs to rule out other causes of tachycardia and CT to rule out spread of infection. CT-negative patient with anemia but improved from prior. She denies any active bleeding. Repeat vitals patient afebrile with tachycardia has resolved. Patient reports significant improvement in the ED. Patient tolerating fluids without difficulty. Patient stable for discharge. PCP follow up.  Discussed return precautions with patient. Discussed all results and patient verbalizes understanding and agrees with plan.  I personally performed the services described in this documentation, which was scribed in my presence. The recorded information has been reviewed and is accurate.  This is a shared patient. This patient was discussed with the physician who saw and evaluated the patient and agrees with the plan.  Filed Vitals:   08/31/14 1710 08/31/14 1928 08/31/14 2121 09/01/14 0006  BP: 118/62 109/54 116/76 119/73  Pulse: 106 116 112 98  Temp: 101.6 F (38.7 C) 99 F (37.2 C) 101.1 F (38.4 C) 98.4 F (36.9 C)  TempSrc: Oral Oral Oral Oral  Resp: 18 20 18 18   Height: 4\' 9"  (1.448 m)     Weight: 110 lb (49.896 kg)     SpO2: 100% 100% 100% 100%      Oswaldo Conroy, PA-C 09/01/14 2251  Jerelyn Scott, MD 09/01/14 2257

## 2014-09-01 LAB — BASIC METABOLIC PANEL
Anion gap: 8 (ref 5–15)
CO2: 21 mmol/L — ABNORMAL LOW (ref 22–32)
CREATININE: 0.5 mg/dL (ref 0.44–1.00)
Calcium: 8.1 mg/dL — ABNORMAL LOW (ref 8.9–10.3)
Chloride: 107 mmol/L (ref 101–111)
GFR calc Af Amer: >60 mL/min (ref >60)
GFR calc non Af Amer: >60 mL/min (ref >60)
Glucose, Bld: 101 mg/dL — ABNORMAL HIGH (ref 65–99)
Potassium: 3.3 mmol/L — ABNORMAL LOW (ref 3.5–5.1)
SODIUM: 136 mmol/L (ref 135–145)

## 2014-09-01 MED ORDER — ONDANSETRON 4 MG PO TBDP
4.0000 mg | ORAL_TABLET | Freq: Once | ORAL | Status: AC
  Administered 2014-09-01: 4 mg via ORAL
  Filled 2014-09-01: qty 1

## 2014-09-01 MED ORDER — POTASSIUM CHLORIDE CRYS ER 20 MEQ PO TBCR
40.0000 meq | EXTENDED_RELEASE_TABLET | Freq: Once | ORAL | Status: AC
  Administered 2014-09-01: 40 meq via ORAL
  Filled 2014-09-01: qty 2

## 2014-09-11 ENCOUNTER — Encounter (HOSPITAL_COMMUNITY): Payer: Self-pay

## 2014-09-11 ENCOUNTER — Inpatient Hospital Stay (HOSPITAL_COMMUNITY)
Admission: AD | Admit: 2014-09-11 | Discharge: 2014-09-11 | Disposition: A | Discharge status: Home / Self Care | Payer: 59 | Source: Ambulatory Visit | Attending: Obstetrics and Gynecology | Admitting: Obstetrics and Gynecology

## 2014-09-11 DIAGNOSIS — B3731 Acute candidiasis of vulva and vagina: Secondary | ICD-10-CM

## 2014-09-11 DIAGNOSIS — B373 Candidiasis of vulva and vagina: Secondary | ICD-10-CM | POA: Diagnosis not present

## 2014-09-11 DIAGNOSIS — L293 Anogenital pruritus, unspecified: Secondary | ICD-10-CM | POA: Diagnosis present

## 2014-09-11 LAB — WET PREP, GENITAL
Clue Cells Wet Prep HPF POC: NONE SEEN
Trich, Wet Prep: NONE SEEN

## 2014-09-11 MED ORDER — FLUCONAZOLE 200 MG PO TABS: 200.0000 mg | ORAL_TABLET | Freq: Every day | ORAL | Status: AC

## 2014-09-11 NOTE — MAU Provider Note (Signed)
CSN: [id number]     Arrival date & time 09/11/14  1836 History   None    Chief Complaint  Patient presents with  . Vaginal Itching     (Consider location/radiation/quality/duration/timing/severity/associated sxs/prior Treatment) Patient is a 24 y.o. female presenting with vaginal itching. The history is provided by the patient.  Vaginal Itching This is a new problem. The current episode started in the past 7 days. The problem occurs constantly. The problem has been gradually worsening.   Shelley Mckinney is a 24 y.o. female who presents to the ED with vaginal itching. She reports having strep throat a couple weeks ago and treated with antibiotics. Now for the past week she has vaginal itching and disharge. She denies other problems. She has implant for birth control.   Past Medical History  Diagnosis Date  . Strep throat   . Chlamydia   . Infection     PARTNER + CHLAMYDIA  . Infection     FREQUENT YEAST  . Infection     BV  . NSVD (normal spontaneous vaginal delivery) 08/24/2012    2311  . Pregnancy induced hypertension 2014    post partum HTN  . Anemia 2014    during pregnancy  . ROM (rupture of membranes), premature 10/25/2013   Past Surgical History  Procedure Laterality Date  . Therapeutic abortion      x3   Family History  Problem Relation Age of Onset  . Drug abuse Mother   . Drug abuse Father   . Diabetes Sister   . Down syndrome Maternal Uncle   . Cancer Paternal Uncle   . Diabetes Paternal Grandmother   . Cancer Paternal Grandfather     COLON  . Down syndrome Cousin     PATERNAL   History  Substance Use Topics  . Smoking status: Never Smoker   . Smokeless tobacco: Never Used  . Alcohol Use: No     Comment: RARELY   OB History    Gravida Para Term Preterm AB TAB SAB Ectopic Multiple Living   Review of Systems Negative except as stated in HPI   Allergies  Review of patient's allergies indicates no known allergies.  Home  Medications   Prior to Admission medications   Medication Sig Start Date End Date Taking? Authorizing Provider  etonogestrel (NEXPLANON) 68 MG IMPL implant 1 each by Subdermal route once.   Yes Historical Provider, MD  HYDROcodone-acetaminophen (NORCO/VICODIN) 5-325 MG per tablet Take 1 tablet by mouth every 6 (six) hours as needed. 08/31/14  Yes Oswaldo Conroy, PA-C  fluconazole (DIFLUCAN) 200 MG tablet Take 1 tablet (200 mg total) by mouth daily. 09/11/14 09/18/14  Axxel Gude Orlene Och, NP  ibuprofen (ADVIL,MOTRIN) 600 MG tablet Take 1 tablet (600 mg total) by mouth every 6 (six) hours as needed. Patient not taking: Reported on 09/11/2014 08/31/14   Oswaldo Conroy, PA-C   BP 106/65 mmHg  Pulse 79  Temp(Src) 98.8 F (37.1 C) (Oral)  Resp 18  Ht  (1.448 m)  Wt 111 lb (50.349 kg)  BMI 24.01 kg/m2 Physical Exam  Constitutional: She is oriented to person, place, and time. She appears well-developed and well-nourished. No distress.  HENT:  Head: Normocephalic.  Eyes: EOM are normal.  Neck: Neck supple.  Cardiovascular: Normal rate.   Pulmonary/Chest: Effort normal.  Abdominal: Soft. There is no tenderness.  Genitourinary:  External genitalia with irritation and mild erythema.  Thick white d/c vaginal vault. No CMT, no adnexal tenderness or mass palpated. Uterus without palpable enlargement.   Musculoskeletal: Normal range of motion.  Neurological: She is alert and oriented to person, place, and time. No cranial nerve deficit.  Skin: Skin is warm and dry.  Psychiatric: She has a normal mood and affect. Her behavior is normal.  Nursing note and vitals reviewed.   ED Course  Procedures (including critical care time) Discussed with Dr. Ambrose MantleHenley. Labs Review Results for orders placed or performed during the hospital encounter of 09/11/14 (from the past 24 hour(s))  Wet prep, genital     Status: Abnormal   Collection Time: 09/11/14  7:00 PM  Result Value Ref Range   Yeast Wet Prep HPF POC  MODERATE (A) NONE SEEN   Trich, Wet Prep NONE SEEN NONE SEEN   Clue Cells Wet Prep HPF POC NONE SEEN NONE SEEN   WBC, Wet Prep HPF POC FEW (A) NONE SEEN      MDM  24 y.o. female with vaginal itching and d/c after being on antibiotics. She will follow up in the office as schedule. Rx given for Diflucan.  Final diagnoses:  Monilial vaginitis

## 2014-09-11 NOTE — Discharge Instructions (Signed)

## 2014-09-11 NOTE — MAU Note (Signed)
Vaginal itching and irritation, thinks she has a yeast infection.   Just finished antibiotics

## 2014-09-12 LAB — GC/CHLAMYDIA PROBE AMP (~~LOC~~) NOT AT ARMC
CHLAMYDIA, DNA PROBE: NEGATIVE
NEISSERIA GONORRHEA: NEGATIVE

## 2015-08-29 ENCOUNTER — Encounter (HOSPITAL_COMMUNITY): Payer: Self-pay | Admitting: *Deleted

## 2015-08-29 ENCOUNTER — Inpatient Hospital Stay (HOSPITAL_COMMUNITY)
Admission: AD | Admit: 2015-08-29 | Discharge: 2015-08-29 | Disposition: A | Discharge status: Home / Self Care | Payer: 59 | Source: Ambulatory Visit | Attending: Obstetrics and Gynecology | Admitting: Obstetrics and Gynecology

## 2015-08-29 DIAGNOSIS — B3731 Acute candidiasis of vulva and vagina: Secondary | ICD-10-CM

## 2015-08-29 DIAGNOSIS — B373 Candidiasis of vulva and vagina: Secondary | ICD-10-CM | POA: Insufficient documentation

## 2015-08-29 LAB — URINALYSIS, ROUTINE W REFLEX MICROSCOPIC
Bilirubin Urine: NEGATIVE
GLUCOSE, UA: NEGATIVE mg/dL
KETONES UR: 15 mg/dL — AB
Leukocytes, UA: NEGATIVE
Nitrite: NEGATIVE
PROTEIN: NEGATIVE mg/dL
Specific Gravity, Urine: 1.02 (ref 1.005–1.030)
pH: 6.5 (ref 5.0–8.0)

## 2015-08-29 LAB — POCT PREGNANCY, URINE: Preg Test, Ur: NEGATIVE

## 2015-08-29 LAB — WET PREP, GENITAL
Sperm: NONE SEEN
Trich, Wet Prep: NONE SEEN

## 2015-08-29 LAB — URINE MICROSCOPIC-ADD ON: WBC UA: NONE SEEN WBC/hpf (ref 0–5)

## 2015-08-29 MED ORDER — FLUCONAZOLE 150 MG PO TABS: 150.0000 mg | ORAL_TABLET | Freq: Once | ORAL | Status: DC

## 2015-08-29 NOTE — MAU Note (Signed)
Patient presents stating she is not pregnant with c/o vaginal irritation X 2 days. Denies bleeding now but states she has a discharge.

## 2015-08-29 NOTE — Discharge Instructions (Signed)
Probiotics WHAT ARE PROBIOTICS? Probiotics are the good bacteria and yeasts that live in your body and keep you and your digestive system healthy. Probiotics also help your body's defense (immune) system and protect your body against bad bacterial growth.  Certain foods contain probiotics, such as yogurt. Probiotics can also be purchased as a supplement. As with any supplement or drug, it is important to discuss its use with your health care provider.  WHAT AFFECTS THE BALANCE OF BACTERIA IN MY BODY? The balance of bacteria in your body can be affected by:   Antibiotic medicines. Antibiotics are sometimes necessary to treat infection. Unfortunately, they may kill good or friendly bacteria in your body as well as the bad bacteria. This may lead to stomach problems like diarrhea, gas, and cramping.  Disease. Some conditions are the result of an overgrowth of bad bacteria, yeasts, parasites, or fungi. These conditions include:   Infectious diarrhea.  Stomach and respiratory infections.  Skin infections.  Irritable bowel syndrome (IBS).  Inflammatory bowel diseases.  Ulcer due to Helicobacter pylori (H. pylori) infection.  Tooth decay and periodontal disease.  Vaginal infections. Stress and poor diet may also lower the good bacteria in your body.  WHAT TYPE OF PROBIOTIC IS RIGHT FOR ME? Probiotics are available over the counter at your local pharmacy, health food, or grocery store. They come in many different forms, combinations of strains, and dosing strengths. Some may need to be refrigerated. Always read the label for storage and usage instructions. Specific strains have been shown to be more effective for certain conditions. Ask your health care provider what option is best for you.  WHY WOULD I NEED PROBIOTICS? There are many reasons your health care provider might recommend a probiotic supplement, including:   Diarrhea.  Constipation.  IBS.  Respiratory infections.  Yeast  infections.  Acne, eczema, and other skin conditions.  Frequent urinary tract infections (UTIs). ARE THERE SIDE EFFECTS OF PROBIOTICS? Some people experience mild side effects when taking probiotics. Side effects are usually temporary and may include:   Gas.  Bloating.  Cramping. Rarely, serious side effects, such as infection or immune system changes, may occur. WHAT ELSE DO I NEED TO KNOW ABOUT PROBIOTICS?   There are many different strains of probiotics. Certain strains may be more effective depending on your condition. Probiotics are available in varying doses. Ask your health care provider which probiotic you should use and how often.   If you are taking probiotics along with antibiotics, it is generally recommended to wait at least 2 hours between taking the antibiotic and taking the probiotic.  FOR MORE INFORMATION:  [facility] http://potts.com/http://nccam.nih.gov/   This information is not intended to replace advice given to you by your health care provider. Make sure you discuss any questions you have with your health care provider.   Document Released: 09/13/2013 Document Reviewed: 09/13/2013 Elsevier Interactive Patient Education [employer]2016 [employer]  Vaginitis Vaginitis is an inflammation of the vagina. It can happen when the normal bacteria and yeast in the vagina grow too much. There are different types. Treatment will depend on the type you have. HOME CARE  Take all medicines as told by your doctor.  Keep your vagina area clean and dry. Avoid soap. Rinse the area with water.  Avoid washing and cleaning out the vagina (douching).  Do not use tampons or have sex (intercourse) until your treatment is done.  Wipe from front to back after going to the restroom.  Wear cotton underwear.  Avoid wearing underwear while you sleep until your vaginitis is gone.  Avoid tight pants. Avoid underwear or nylons without a cotton  panel.  Take off wet clothing (such as a bathing suit) as soon as you can.  Use mild, unscented products. Avoid fabric softeners and scented:  Feminine sprays.  Laundry detergents.  Tampons.  Soaps or bubble baths.  Practice safe sex and use condoms. GET HELP RIGHT AWAY IF:   You have belly (abdominal) pain.  You have a fever or lasting symptoms for more than 2-3 days.  You have a fever and your symptoms suddenly get worse. MAKE SURE YOU:   Understand these instructions.  Will watch this condition.  Will get help right away if you are not doing well or get worse.   This information is not intended to replace advice given to you by your health care provider. Make sure you discuss any questions you have with your health care provider.   Document Released: 05/15/2008 Document Revised: 11/11/2011 Document Reviewed: 07/30/2011 Elsevier Interactive Patient Education [employer]2016 [employer] Inc.

## 2015-08-29 NOTE — MAU Provider Note (Signed)
Chief Complaint:  Vaginal Irritation    Seen by provider at 1920hrs    HPI  Shelley Mckinney is a 25 y.o. [id number] who presents to maternity admissions reporting vaginal irritation for 2 days.  Has had this before and it was yeast. Usually takes Diflucan with success.  Believes it is related to her Nuvaring, recently started that.  Also wants GC/ Chlamydia testing, "just in case".She reports no vaginal bleeding, vaginal itching/burning, urinary symptoms, h/a, dizziness, n/v, or fever/chills.     Past Medical History: Past Medical History  Diagnosis Date  . Strep throat   . Chlamydia   . Infection     PARTNER + CHLAMYDIA  . Infection     FREQUENT YEAST  . Infection     BV  . NSVD (normal spontaneous vaginal delivery) 08/24/2012    2311  . Pregnancy induced hypertension 2014    post partum HTN  . Anemia 2014    during pregnancy  . ROM (rupture of membranes), premature 10/25/2013    Past obstetric history: OB History  Gravida Para Term Preterm AB SAB TAB Ectopic Multiple Living  6 3 3  3  3   3     # Outcome Date GA Lbr Len/2nd Weight Sex Delivery Anes PTL Lv  6 Term 10/25/13 6817w1d 06:20 / 00:17 6 lb 5.8 oz (2.885 kg) F Vag-Spont EPI  Y  5 Term 08/24/12 7027w3d / 00:09 6 lb 11 oz (3.033 kg) M Vag-Spont EPI  Y     Comments: anemia, htn after pregnancy  4 TAB 2012 3689w0d         3 TAB 2012 6789w0d         2 TAB 2011 5189w0d         1 Term 01/2007 9327w3d 14:00 6 lb 3 oz (2.807 kg) M Vag-Spont EPI N Y     Comments: NO COMP; RHOGm      Past Surgical History: Past Surgical History  Procedure Laterality Date  . Therapeutic abortion      x3    Family History: Family History  Problem Relation Age of Onset  . Drug abuse Mother   . Drug abuse Father   . Diabetes Sister   . Down syndrome Maternal Uncle   . Cancer Paternal Uncle   . Diabetes Paternal Grandmother   . Cancer Paternal Grandfather     COLON  . Down syndrome Cousin     PATERNAL    Social History: Social History   Substance Use Topics  . Smoking status: Never Smoker   . Smokeless tobacco: Never Used  . Alcohol Use: No     Comment: RARELY    Allergies: No Known Allergies  Meds:  Prescriptions prior to admission  Medication Sig Dispense Refill Last Dose  . etonogestrel (NEXPLANON) 68 MG IMPL implant 1 each by Subdermal route once.   Continuous  . HYDROcodone-acetaminophen (NORCO/VICODIN) 5-325 MG per tablet Take 1 tablet by mouth every 6 (six) hours as needed. 15 tablet 0 Past Week at Unknown time  . ibuprofen (ADVIL,MOTRIN) 600 MG tablet Take 1 tablet (600 mg total) by mouth every 6 (six) hours as needed. (Patient not taking: Reported on 09/11/2014) 30 tablet 0 Not Taking at Unknown time    I have reviewed patient's Past Medical Hx, Surgical Hx, Family Hx, Social Hx, medications and allergies.  ROS:  Review of Systems  Constitutional: Negative for fever and chills.  Gastrointestinal: Negative for nausea, vomiting, abdominal pain, diarrhea and constipation.  Genitourinary: Positive for vaginal discharge and vaginal pain. Negative for vaginal bleeding, difficulty urinating, genital sores, menstrual problem and pelvic pain.   Other systems negative     Physical Exam  Patient Vitals for the past 24 hrs:  BP Temp Temp src Pulse Resp Height Weight  08/29/15 1916 120/67 mmHg 98.5 F (36.9 C) Oral 90 16 4\' 9"  (1.448 m) 130 lb (58.968 kg)   Constitutional: Well-developed, well-nourished female in no acute distress.  Cardiovascular: normal rate and rhythm, no ectopy audible, S1 & S2 heard, no murmur Respiratory: normal effort, no distress. Lungs CTAB with no wheezes or crackles GI: Abd soft, non-tender.  Nondistended.  No rebound, No guarding.  Bowel Sounds audible  MS: Extremities nontender, no edema, normal ROM Neurologic: Alert and oriented x 4.   Grossly nonfocal. GU: Neg CVAT. Skin:  Warm and Dry Psych:  Affect appropriate.  PELVIC EXAM: Cervix pink, visually closed, without lesion,  scant white creamy discharge, vaginal walls and external genitalia normal with no erethema     Labs: Results for orders placed or performed during the hospital encounter of 08/29/15 (from the past 24 hour(s))  Wet prep, genital     Status: Abnormal   Collection Time: 08/29/15  7:30 PM  Result Value Ref Range   Yeast Wet Prep HPF POC PRESENT (A) NONE SEEN   Trich, Wet Prep NONE SEEN NONE SEEN   Clue Cells Wet Prep HPF POC PRESENT (A) NONE SEEN   WBC, Wet Prep HPF POC FEW (A) NONE SEEN   Sperm NONE SEEN     Imaging:  No results found.  MAU Course/MDM: I have ordered labs as follows: Wet prep, GC/Chlamydia Imaging ordered: none Results reviewed.   Consult Dr Ellyn HackBovard  Treatments in MAU included none.   Pt stable at time of discharge.  Assessment: Yeast vaginitis Recurrent vaginitis  Plan: Discharge home Recommend Start probiotics for preventative help Rx sent for Diflucan for one day, repeat if necessary     Medication List    ASK your doctor about these medications        HYDROcodone-acetaminophen 5-325 MG tablet  Commonly known as:  NORCO/VICODIN  Take 1 tablet by mouth every 6 (six) hours as needed.     ibuprofen 600 MG tablet  Commonly known as:  ADVIL,MOTRIN  Take 1 tablet (600 mg total) by mouth every 6 (six) hours as needed.     NEXPLANON 68 MG Impl implant  Generic drug:  etonogestrel  1 each by Subdermal route once.       Encouraged to return here or to other Urgent Care/ED if she develops worsening of symptoms, increase in pain, fever, or other concerning symptoms.   Wynelle BourgeoisMarie Maddyson Keil CNM, MSN Certified Nurse-Midwife 08/29/2015 7:21 PM

## 2015-08-30 LAB — GC/CHLAMYDIA PROBE AMP (~~LOC~~) NOT AT ARMC
Chlamydia: NEGATIVE
NEISSERIA GONORRHEA: NEGATIVE

## 2016-07-06 IMAGING — CT CT NECK W/ CM
3 series · 18 of 33 positions shown, 22 images · IV contrast (Iodine)
Comparison: None.

CLINICAL DATA: 23-year-old female with sore throat x1 week. Painful
swallowing and fever.

EXAM:
CT NECK WITH CONTRAST
TECHNIQUE: Multidetector CT imaging of the neck was performed using the
standard protocol following the bolus administration of intravenous
contrast.
CONTRAST:  100mL OMNIPAQUE IOHEXOL 300 MG/ML  SOLN

[Series 201: soft tissue, idose (2) · axial · 0.47mm/px · z∈[+953,+1085]mm · 10 of 78 slices shown, 13 images]
[im 6/78  soft-tissue]
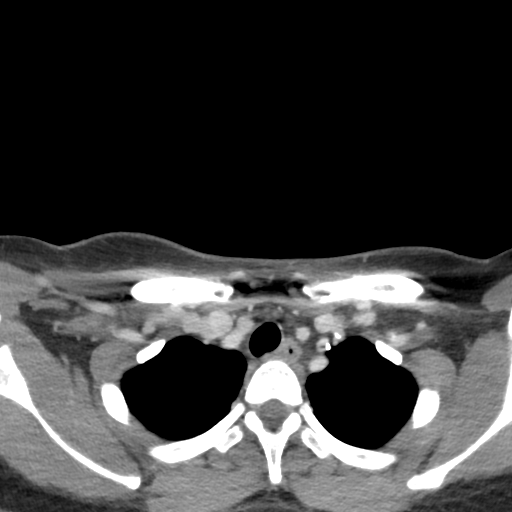
[im 6/78  bone]
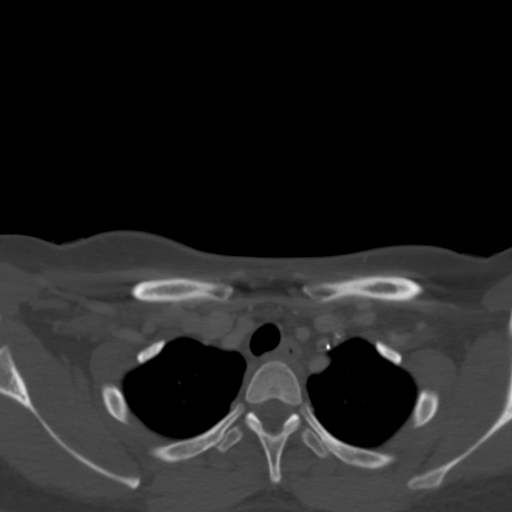
[im 12/78  bone]
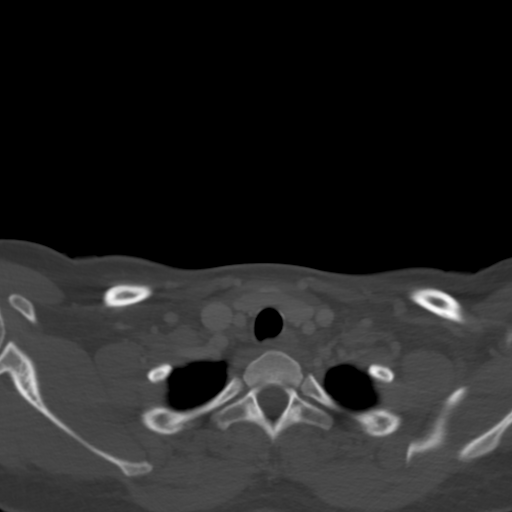
[im 24/78  bone]
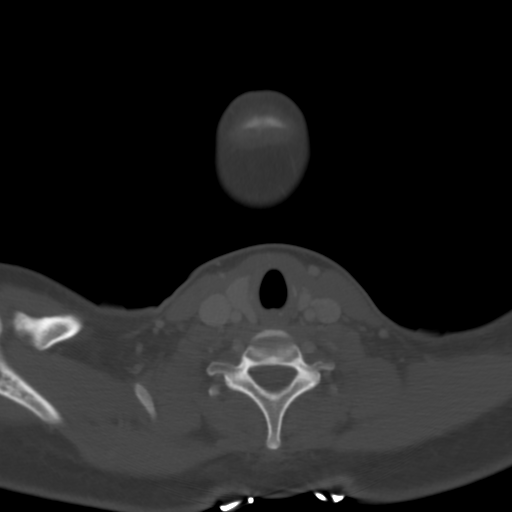
[im 30/78  bone]
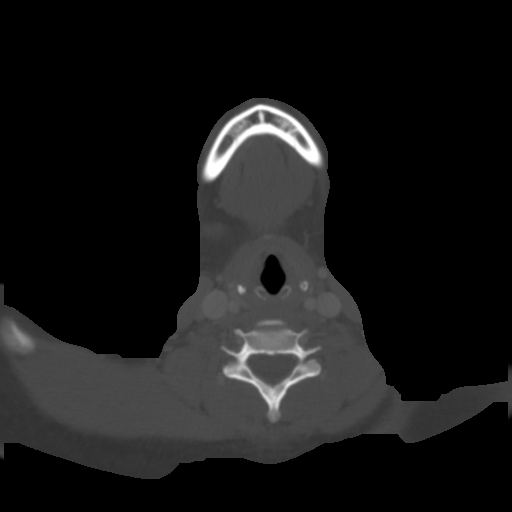
[im 36/78  soft-tissue]
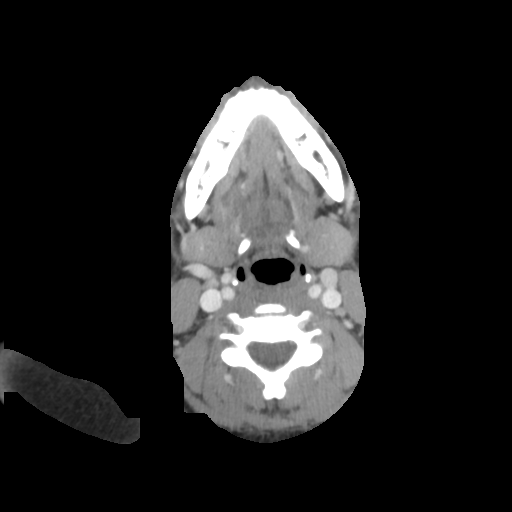
[im 36/78  bone]
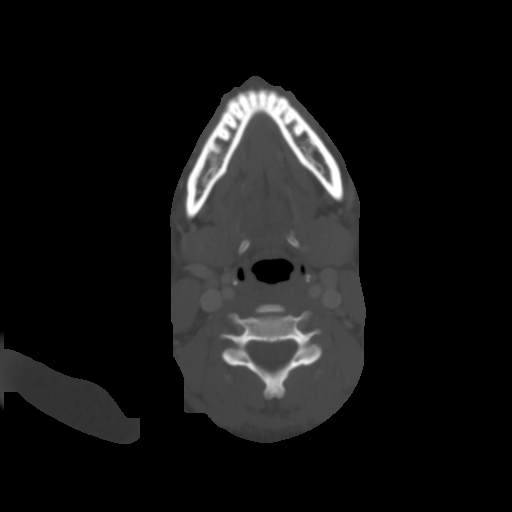
[im 42/78  bone]
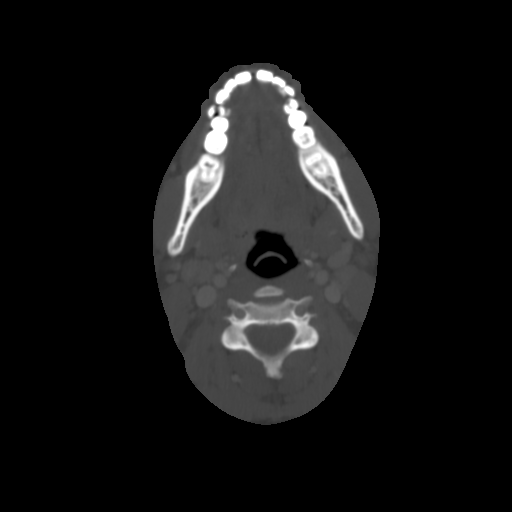
[im 48/78  bone]
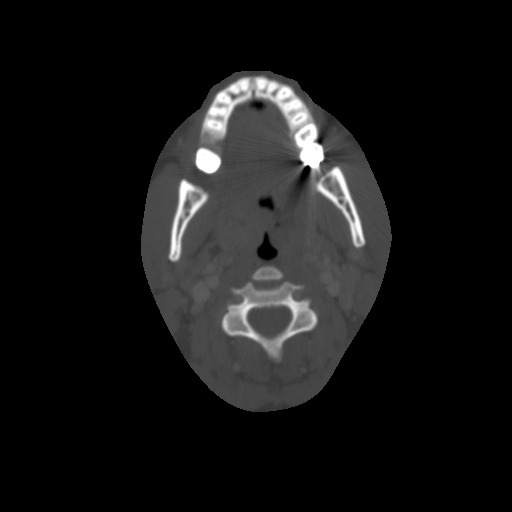
[im 60/78  bone]
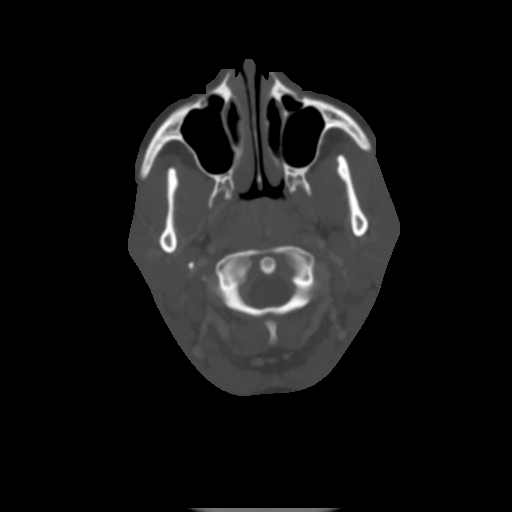
[im 66/78  soft-tissue]
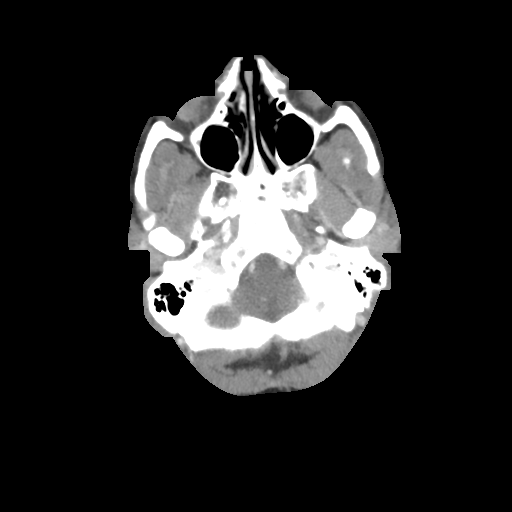
[im 66/78  bone]
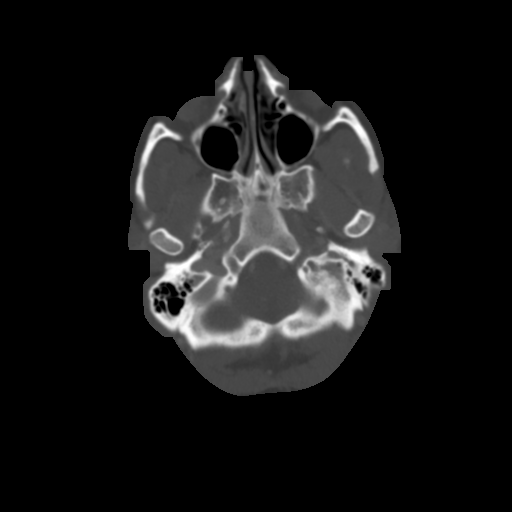
[im 72/78  bone]
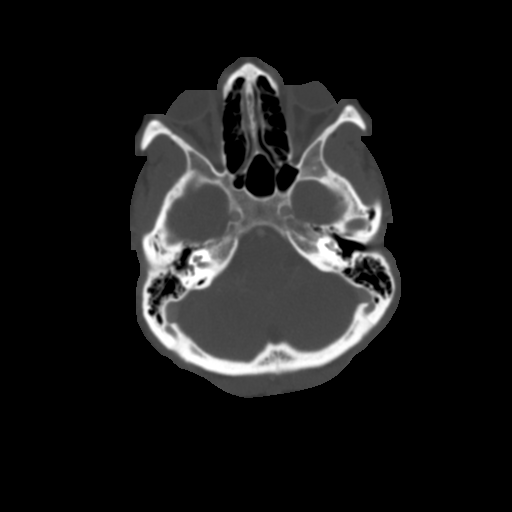

[Series 203: coronal, idose (2) · coronal · 0.45mm/px · 3 of 116 slices shown]
[im 24/116  bone]
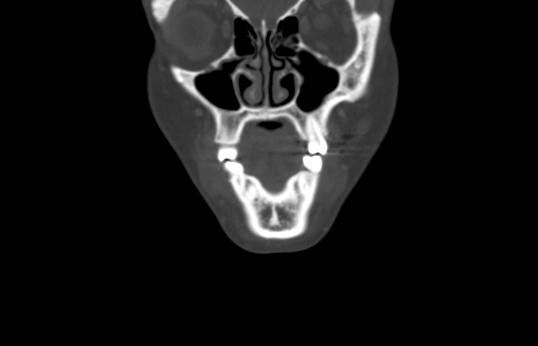
[im 47/116  bone]
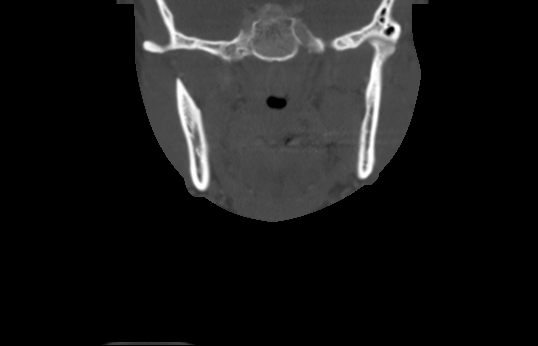
[im 70/116  bone]
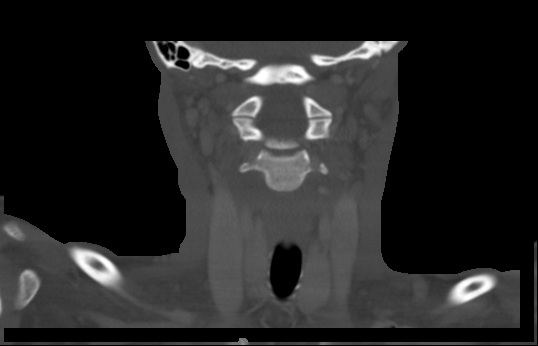

[Series 204: sagittal, idose (2) · sagittal · 0.45mm/px · 5 of 119 slices shown, 6 images]
[im 40/119  bone]
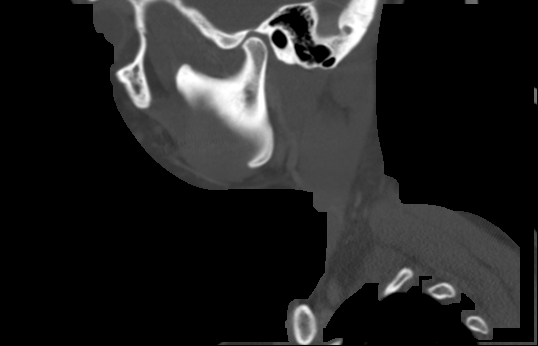
[im 50/119  bone]
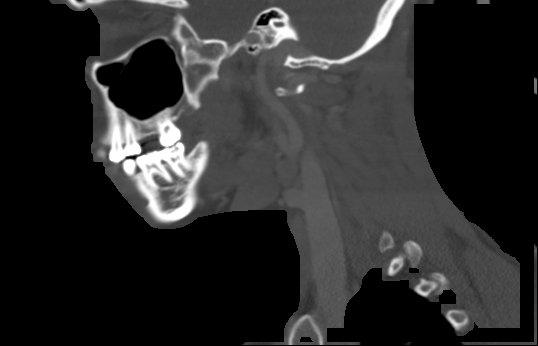
[im 60/119  soft-tissue]
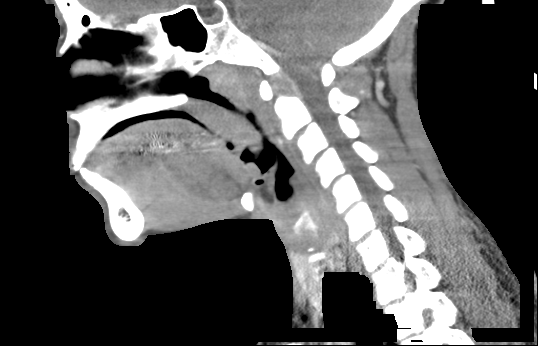
[im 60/119  bone]
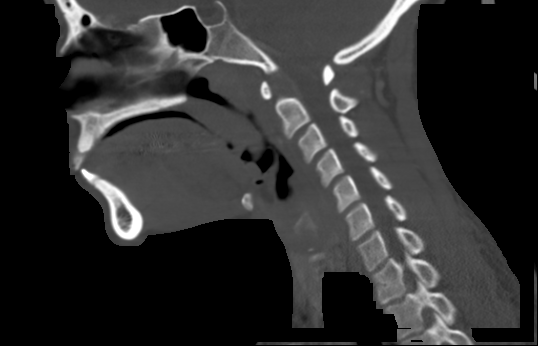
[im 69/119  bone]
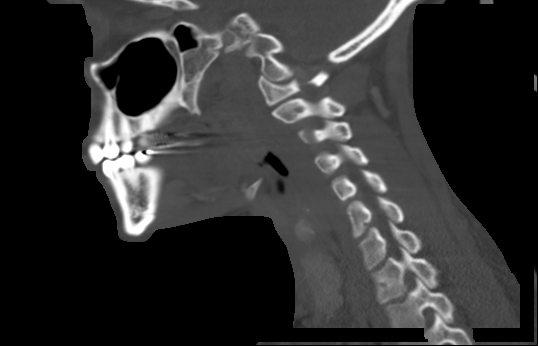
[im 79/119  bone]
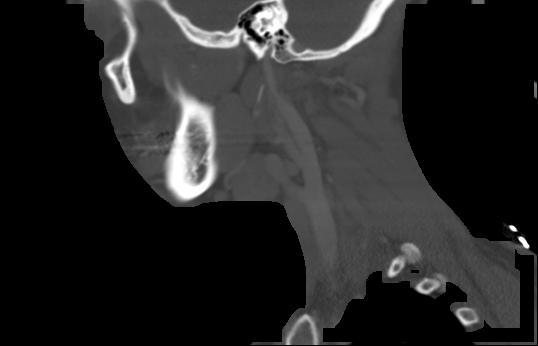

[18 of 33 positions shown; findings below may reference images not displayed]

FINDINGS: Pharynx and larynx: There is minimal asymmetric prominence of the
right tonsil with a focal ill-defined hypodense area with
heterogeneous enhancement which may represent phlegmonous changes.
No drainable fluid collection/ abscess identified. Clinical
correlation is recommended.

Salivary glands: Unremarkable.

Thyroid: Unremarkable.

Lymph nodes: Bilateral anterior cervical adenopathy, right greater
than left. There is prominence of the adenoidal tissue.

Vascular: Unremarkable.

Limited intracranial: Unremarkable.

Visualized orbits: Unremarkable.

Mastoids and visualized paranasal sinuses: Clear.

Skeleton: Unremarkable.  No fracture.

Upper chest: Clear.
IMPRESSION: Mild right tonsillar prominence. No drainable fluid collection/
abscess identified at this time.

Bilateral cervical adenopathy.

## 2016-10-07 ENCOUNTER — Encounter (HOSPITAL_COMMUNITY): Payer: Self-pay | Admitting: Emergency Medicine

## 2016-10-07 ENCOUNTER — Emergency Department (HOSPITAL_COMMUNITY)
Admission: EM | Admit: 2016-10-07 | Discharge: 2016-10-07 | Disposition: A | Discharge status: Home / Self Care | Payer: 59 | Attending: Emergency Medicine | Admitting: Emergency Medicine

## 2016-10-07 DIAGNOSIS — R2242 Localized swelling, mass and lump, left lower limb: Secondary | ICD-10-CM | POA: Diagnosis present

## 2016-10-07 DIAGNOSIS — Z79899 Other long term (current) drug therapy: Secondary | ICD-10-CM | POA: Insufficient documentation

## 2016-10-07 DIAGNOSIS — L03116 Cellulitis of left lower limb: Secondary | ICD-10-CM

## 2016-10-07 MED ORDER — CEPHALEXIN 500 MG PO CAPS: 500.0000 mg | ORAL_CAPSULE | Freq: Four times a day (QID) | ORAL | 0 refills | Status: DC

## 2016-10-07 NOTE — ED Triage Notes (Signed)
Patient c/o knot on posterior upper leg that started about week ago and has gotten larger since. Patient denies any drainage but reports lot of pain esp when sitting putting pressure on area.

## 2016-10-07 NOTE — ED Provider Notes (Signed)
[facility]EM[facility]ENCY DEPT Provider Note   CSN: [id number] Arrival date & time: 10/07/16  1701   By signing my name below, I, Soijett Blue, attest that this documentation has been prepared under the direction and in the presence of Leary RocaMichael Madisun Hargrove, [employer] CorporationPA-C Electronically Signed: Soijett Blue, ED Scribe. 10/07/16. 7:51 PM.  History   Chief Complaint Chief Complaint  Patient presents with  . knot on posterior leg    HPI Shelley Mckinney is a 26 y.o. female who presents to the Emergency Department complaining of gradually worsening, "knot" to posterior left upper leg onset 1 week ago. Pt reports associated redness to surrounding area. Pt has not tried any medications for the relief of her symptoms. She notes that the affected area is a 6/10 with applying pressure, 4/10 at rest. Denies hx of similar symptoms. She denies fever, chills, drainage, numbness, tingling, urinary frequency, hematuria, dysuria, recent abx use, and any other symptoms. Denies allergies to medications.   The history is provided by the patient. No language interpreter was used.    Past Medical History:  Diagnosis Date  . Anemia 2014   during pregnancy  . Chlamydia   . Infection    PARTNER + CHLAMYDIA  . Infection    FREQUENT YEAST  . Infection    BV  . NSVD (normal spontaneous vaginal delivery) 08/24/2012   2311  . Pregnancy induced hypertension 2014   post partum HTN  . ROM (rupture of membranes), premature 10/25/2013  . Strep throat     Patient Active Problem List   Diagnosis Date Noted  . Term pregnancy 10/25/2013  . ROM (rupture of membranes), premature 10/25/2013  . NSVD (normal spontaneous vaginal delivery) 10/25/2013  . Mild or unspecified pre-eclampsia, with delivery 08/27/2012  . Laceration of periurethral tissue with delivery, delivered 08/24/2012  . GBS (group B Streptococcus carrier), +RV culture, currently pregnant 08/23/2012  . Spotting during pregnancy 04/26/2012  . Pregnant state, incidental  03/17/2012  . CANDIDIASIS OF VULVA AND VAGINA 05/28/2009  . ANEMIA 05/28/2009  . VAGINAL DISCHARGE 02/27/2009    Past Surgical History:  Procedure Laterality Date  . THERAPEUTIC ABORTION     x3    OB History    Gravida Para Term Preterm AB Living   6 3 3   3 3    SAB TAB Ectopic Multiple Live Births     3     3       Home Medications    Prior to Admission medications   Medication Sig Start Date End Date Taking? Authorizing Provider  cephALEXin (KEFLEX) 500 MG capsule Take 1 capsule (500 mg total) by mouth 4 (four) times daily. 10/07/16   Adriella Essex, Elmer SowMichael M, PA-C  etonogestrel-ethinyl estradiol (NUVARING) 0.12-0.015 MG/24HR vaginal ring Place 1 each vaginally every 28 (twenty-eight) days. Insert vaginally and leave in place for 3 consecutive weeks, then remove for 1 week.    [provider]  fluconazole (DIFLUCAN) 150 MG tablet Take 1 tablet (150 mg total) by mouth once. 08/29/15   Aviva SignsWilliams, Marie L, CNM    Family History Family History  Problem Relation Age of Onset  . Drug abuse Mother   . Drug abuse Father   . Diabetes Sister   . Down syndrome Maternal Uncle   . Cancer Paternal Uncle   . Diabetes Paternal Grandmother   . Cancer Paternal Grandfather        COLON  . Down syndrome Cousin        PATERNAL    Social  History Social History  Substance Use Topics  . Smoking status: Never Smoker  . Smokeless tobacco: Never Used  . Alcohol use No     Comment: RARELY     Allergies   Patient has no known allergies.   Review of Systems Review of Systems  Constitutional: Negative for chills and fever.  Genitourinary: Negative for dysuria, frequency and hematuria.  Skin: Positive for color change (redness to the affected area).       +"knot" to posterior left upper leg without drainage  Neurological: Negative for numbness.       No tingling     Physical Exam Updated Vital Signs BP 107/63 (BP Location: Left Arm)   Pulse 83   Temp 97.9 F (36.6 C) (Oral)    Resp 18   LMP 09/20/2016   SpO2 100%   Physical Exam  Constitutional: She appears well-developed and well-nourished.  HENT:  Head: Normocephalic and atraumatic.  Right Ear: External ear normal.  Left Ear: External ear normal.  Eyes: Conjunctivae are normal. Right eye exhibits no discharge. Left eye exhibits no discharge. No scleral icterus.  Pulmonary/Chest: Effort normal. No respiratory distress.  Musculoskeletal: Normal range of motion. She exhibits no edema, tenderness or deformity.  Compartments soft. ROM nl.   Neurological: She is alert. She has normal strength. No sensory deficit. Gait normal.  NVI intact.   Skin: Skin is warm and dry. No pallor.  3 x 2 cm area of erythema just distal to left buttocks. No fluctuance, but firmness noted. Possible 1-2 mm head in the center. No drainage.   Psychiatric: She has a normal mood and affect.  Nursing note and vitals reviewed.    ED Treatments / Results  DIAGNOSTIC STUDIES: Oxygen Saturation is 100% on RA, nl by my interpretation.    COORDINATION OF CARE: 7:50 PM Discussed treatment plan with pt at bedside and pt agreed to plan.   Labs (all labs ordered are listed, but only abnormal results are displayed) Labs Reviewed - No data to display  EKG  EKG Interpretation None       Radiology No results found.  Procedures [place] bedside Date/Time: 10/07/2016 7:38 PM Performed by: Jacinto Halim Authorized by: Jacinto Halim  Consent: Verbal consent obtained. Risks and benefits: risks, benefits and alternatives were discussed Consent given by: patient Patient understanding: patient states understanding of the procedure being performed Patient consent: the patient's understanding of the procedure matches consent given Imaging studies: imaging studies available Patient identity confirmed: verbally with patient, arm band, provided demographic data and hospital-assigned identification number Time out: Immediately prior to  procedure a "time out" was called to verify the correct patient, procedure, equipment, support staff and site/side marked as required. Preparation: Patient was prepped and draped in the usual sterile fashion. Local anesthesia used: no  Anesthesia: Local anesthesia used: no  Sedation: Patient sedated: no Patient tolerance: Patient tolerated the procedure well with no immediate complications    (including critical care time)  Medications Ordered in ED Medications - No data to display   Initial Impression / Assessment and Plan / ED Course  I have reviewed the triage vital signs and the nursing notes.  Pertinent labs & imaging results that were available during my care of the patient were reviewed by me and considered in my medical decision making (see chart for details).     26 year old female with Cellulitis. Pt is without risk factors for HIV; no recent use of steroids or other immunosuppressive  medications; no Hx of diabetes. Pt is without gross abscess for which I&D would be possible as confirmed by [place].  Area marked and pt encouraged to return if redness begins to streak, extends beyond the markings, and/or fever or nausea/vomiting develop.  Pt is alert, oriented, NAD, afebrile, non tachycardic, nonseptic and nontoxic appearing.  Pt to be d/c on oral antibiotics with strict f/u instructions.     Final Clinical Impressions(s) / ED Diagnoses   Final diagnoses:  Cellulitis of left lower extremity    New Prescriptions Discharge Medication List as of 10/07/2016  7:55 PM    START taking these medications   Details  cephALEXin (KEFLEX) 500 MG capsule Take 1 capsule (500 mg total) by mouth 4 (four) times daily., Starting Wed 10/07/2016, Print       I personally performed the services described in this documentation, which was scribed in my presence. The recorded information has been reviewed and is accurate.       Jacinto Halim, PA-C 10/08/16 0150    Linwood Dibbles,  MD 10/08/16 [phone]

## 2016-10-07 NOTE — Discharge Instructions (Signed)
You have a skin infection called cellulitis that requires antibiotics. Please take all of your antibiotics until finished!   You may develop abdominal discomfort or diarrhea from the antibiotic.  You may help offset this with probiotics which you can buy or get in yogurt. Do not eat  or take the probiotics until 2 hours after your antibiotic. If your redness exceeds the areas marked today please return for re-evaluation. If you develop worsening or new concerning symptoms you can return to the emergency department for re-evaluation. Please follow up with your PCP in 1 week. If you do not have a PCP you can establish care with the number on the back of this sheet.

## 2017-05-08 ENCOUNTER — Encounter (HOSPITAL_COMMUNITY): Payer: Self-pay | Admitting: *Deleted

## 2017-05-08 ENCOUNTER — Ambulatory Visit (HOSPITAL_COMMUNITY)
Admission: EM | Admit: 2017-05-08 | Discharge: 2017-05-08 | Disposition: A | Discharge status: Home / Self Care | Payer: 59 | Attending: Family Medicine | Admitting: Family Medicine

## 2017-05-08 ENCOUNTER — Other Ambulatory Visit: Payer: Self-pay

## 2017-05-08 DIAGNOSIS — Z3A01 Less than 8 weeks gestation of pregnancy: Secondary | ICD-10-CM

## 2017-05-08 DIAGNOSIS — Z113 Encounter for screening for infections with a predominantly sexual mode of transmission: Secondary | ICD-10-CM | POA: Diagnosis not present

## 2017-05-08 DIAGNOSIS — Z202 Contact with and (suspected) exposure to infections with a predominantly sexual mode of transmission: Secondary | ICD-10-CM | POA: Insufficient documentation

## 2017-05-08 DIAGNOSIS — Z3201 Encounter for pregnancy test, result positive: Secondary | ICD-10-CM | POA: Diagnosis not present

## 2017-05-08 LAB — POCT URINALYSIS DIP (DEVICE)
Bilirubin Urine: NEGATIVE
GLUCOSE, UA: NEGATIVE mg/dL
KETONES UR: 15 mg/dL — AB
Leukocytes, UA: NEGATIVE
Nitrite: NEGATIVE
Protein, ur: NEGATIVE mg/dL
SPECIFIC GRAVITY, URINE: 1.02 (ref 1.005–1.030)
Urobilinogen, UA: 0.2 mg/dL (ref 0.0–1.0)
pH: 7.5 (ref 5.0–8.0)

## 2017-05-08 NOTE — Discharge Instructions (Signed)
You are approximately 4 weeks 4 days pregnant per your last menstrual period. Please start prenatal vitamins.  Avoid alcohol, smoking, ibuprofen. Tylenol as needed for pain. We are testing you for trichomonas, as treatment for this can cause fetal harm. Therefore if you are negative we will not treat you for this. We will call you with any positive test results and discuss treatment. Please withhold from intercourse until tests results have finalized.  Use of condoms to prevent STD's.

## 2017-05-08 NOTE — ED Provider Notes (Signed)
[facility]    CSN: [id number] Arrival date & time: 05/08/17  1409     History   Chief Complaint Chief Complaint  Patient presents with  . Exposure to STD    HPI Shelley Mckinney is a 27 y.o. female.   Shelley Mckinney presents with concerns of exposure to trichomonas. She states her partner was treated yesterday for positive result. She is sexually active with 1 partner. States that the condom broke they had used. Denies any vaginal symptoms, itching, burning, discharge or odor. Denies vaginal bleeding. Denies abdominal pain, back pain or fevers. Denies  Urinary symptoms, pain frequency or burning. Positive home pregnancy test yesterday. LMP 2/5. Hx chlamydia, bv , yeast infections. Has 3 children at home, has had 3 therapeutic abortions.     ROS per HPI.         Past Medical History:  Diagnosis Date  . Anemia 2014   during pregnancy  . Chlamydia   . Infection    PARTNER + CHLAMYDIA  . Infection    FREQUENT YEAST  . Infection    BV  . NSVD (normal spontaneous vaginal delivery) 08/24/2012   2311  . Pregnancy induced hypertension 2014   post partum HTN  . ROM (rupture of membranes), premature 10/25/2013  . Strep throat     Patient Active Problem List   Diagnosis Date Noted  . Term pregnancy 10/25/2013  . ROM (rupture of membranes), premature 10/25/2013  . NSVD (normal spontaneous vaginal delivery) 10/25/2013  . Mild or unspecified pre-eclampsia, with delivery 08/27/2012  . Laceration of periurethral tissue with delivery, delivered 08/24/2012  . GBS (group B Streptococcus carrier), +RV culture, currently pregnant 08/23/2012  . Spotting during pregnancy 04/26/2012  . Pregnant state, incidental 03/17/2012  . CANDIDIASIS OF VULVA AND VAGINA 05/28/2009  . ANEMIA 05/28/2009  . VAGINAL DISCHARGE 02/27/2009    Past Surgical History:  Procedure Laterality Date  . THERAPEUTIC ABORTION     x3    OB History    Gravida Para Term Preterm AB Living   7 3 3   3 3      SAB TAB Ectopic Multiple Live Births     3     3       Home Medications    Prior to Admission medications   Medication Sig Start Date End Date Taking? Authorizing Provider  cephALEXin (KEFLEX) 500 MG capsule Take 1 capsule (500 mg total) by mouth 4 (four) times daily. 10/07/16   Maczis, Elmer Sow, PA-C  etonogestrel-ethinyl estradiol (NUVARING) 0.12-0.015 MG/24HR vaginal ring Place 1 each vaginally every 28 (twenty-eight) days. Insert vaginally and leave in place for 3 consecutive weeks, then remove for 1 week.    [provider]  fluconazole (DIFLUCAN) 150 MG tablet Take 1 tablet (150 mg total) by mouth once. 08/29/15   Aviva Signs, CNM    Family History Family History  Problem Relation Age of Onset  . Drug abuse Mother   . Drug abuse Father   . Diabetes Sister   . Down syndrome Maternal Uncle   . Cancer Paternal Uncle   . Diabetes Paternal Grandmother   . Cancer Paternal Grandfather        COLON  . Down syndrome Cousin        PATERNAL    Social History Social History   Tobacco Use  . Smoking status: Never Smoker  . Smokeless tobacco: Never Used  Substance Use Topics  . Alcohol use: No    Comment:  RARELY  . Drug use: No     Allergies   Patient has no known allergies.   Review of Systems Review of Systems   Physical Exam Triage Vital Signs ED Triage Vitals [05/08/17 1557]  Enc Vitals Group     BP (!) 129/57     Pulse Rate 88     Resp 16     Temp 98.3 F (36.8 C)     Temp Source Oral     SpO2 100 %     Weight      Height      Head Circumference      Peak Flow      Pain Score      Pain Loc      Pain Edu?      Excl. in GC?    No data found.  Updated Vital Signs BP 115/72   Pulse 88   Temp 98.3 F (36.8 C) (Oral)   Resp 16   LMP 04/06/2017 (Exact Date) Comment: pt reports positive pregnancy test at home  SpO2 100%   Breastfeeding? No   Visual Acuity Right Eye Distance:   Left Eye Distance:   Bilateral Distance:     Right Eye Near:   Left Eye Near:    Bilateral Near:     Physical Exam  Constitutional: She is oriented to person, place, and time. She appears well-developed and well-nourished. No distress.  Cardiovascular: Normal rate, regular rhythm and normal heart sounds.  Pulmonary/Chest: Effort normal and breath sounds normal.  Abdominal: There is no tenderness.  Denies abdominal pain or any vaginal symptoms, GU exam deferred.   Neurological: She is alert and oriented to person, place, and time.  Skin: Skin is warm and dry.     UC Treatments / Results  Labs (all labs ordered are listed, but only abnormal results are displayed) Labs Reviewed  POCT URINALYSIS DIP (DEVICE) - Abnormal; Notable for the following components:      Result Value   Ketones, ur 15 (*)    Hgb urine dipstick TRACE (*)    All other components within normal limits  URINE CYTOLOGY ANCILLARY ONLY    EKG  EKG Interpretation None       Radiology No results found.  Procedures Procedures (including critical care time)  Medications Ordered in UC Medications - No data to display   Initial Impression / Assessment and Plan / UC Course  I have reviewed the triage vital signs and the nursing notes.  Pertinent labs & imaging results that were available during my care of the patient were reviewed by me and considered in my medical decision making (see chart for details).     Positive pregnancy in clinic today. Will hold off on treatment for trichomonas until test result complete. Urine cytology pending. Patient states she plans to terminate pregnancy. encouaraged still to start prenatals in the meantime. Discussed calling to set up first appointment as soon as possible. Will notify of any positive findings and if any changes to treatment are needed.  Patient verbalized understanding and agreeable to plan.    Final Clinical Impressions(s) / UC Diagnoses   Final diagnoses:  Possible exposure to STD  Less than [redacted]  weeks gestation of pregnancy    ED Discharge Orders    None       Controlled Substance Prescriptions [place] Controlled Substance Registry consulted? Not Applicable   Georgetta HaberBurky, Kinzee Happel B, NP 05/08/17 [phone]

## 2017-05-08 NOTE — ED Triage Notes (Addendum)
Denies sxs.  Reports partner being diagnosed with trich.  Pt admits to positive pregnancy test at home.

## 2017-05-10 LAB — URINE CYTOLOGY ANCILLARY ONLY
Chlamydia: NEGATIVE
Neisseria Gonorrhea: POSITIVE — AB
TRICH (WINDOWPATH): NEGATIVE

## 2017-05-12 LAB — URINE CYTOLOGY ANCILLARY ONLY: CANDIDA VAGINITIS: NEGATIVE

## 2017-06-29 ENCOUNTER — Encounter (HOSPITAL_COMMUNITY): Payer: Self-pay | Admitting: Emergency Medicine

## 2017-06-29 ENCOUNTER — Ambulatory Visit (HOSPITAL_COMMUNITY)
Admission: EM | Admit: 2017-06-29 | Discharge: 2017-06-29 | Disposition: A | Discharge status: Home / Self Care | Payer: 59 | Attending: Family Medicine | Admitting: Family Medicine

## 2017-06-29 DIAGNOSIS — N898 Other specified noninflammatory disorders of vagina: Secondary | ICD-10-CM

## 2017-06-29 DIAGNOSIS — Z202 Contact with and (suspected) exposure to infections with a predominantly sexual mode of transmission: Secondary | ICD-10-CM | POA: Diagnosis not present

## 2017-06-29 DIAGNOSIS — Z3202 Encounter for pregnancy test, result negative: Secondary | ICD-10-CM | POA: Diagnosis not present

## 2017-06-29 DIAGNOSIS — R300 Dysuria: Secondary | ICD-10-CM | POA: Diagnosis not present

## 2017-06-29 LAB — POCT URINALYSIS DIP (DEVICE)
Bilirubin Urine: NEGATIVE
GLUCOSE, UA: NEGATIVE mg/dL
Ketones, ur: NEGATIVE mg/dL
Leukocytes, UA: NEGATIVE
Nitrite: NEGATIVE
PH: 7 (ref 5.0–8.0)
PROTEIN: NEGATIVE mg/dL
Specific Gravity, Urine: 1.025 (ref 1.005–1.030)
UROBILINOGEN UA: 0.2 mg/dL (ref 0.0–1.0)

## 2017-06-29 LAB — POCT PREGNANCY, URINE: PREG TEST UR: NEGATIVE

## 2017-06-29 MED ORDER — LIDOCAINE HCL (PF) 1 % IJ SOLN
INTRAMUSCULAR | Status: AC
  Filled 2017-06-29: qty 2

## 2017-06-29 MED ORDER — CEFTRIAXONE SODIUM 250 MG IJ SOLR
250.0000 mg | Freq: Once | INTRAMUSCULAR | Status: AC
  Administered 2017-06-29: 250 mg via INTRAMUSCULAR

## 2017-06-29 MED ORDER — AZITHROMYCIN 250 MG PO TABS
ORAL_TABLET | ORAL | Status: AC
  Filled 2017-06-29: qty 4

## 2017-06-29 MED ORDER — CEFTRIAXONE SODIUM 250 MG IJ SOLR
INTRAMUSCULAR | Status: AC
  Filled 2017-06-29: qty 250

## 2017-06-29 MED ORDER — AZITHROMYCIN 250 MG PO TABS
1000.0000 mg | ORAL_TABLET | Freq: Once | ORAL | Status: AC
  Administered 2017-06-29: 1000 mg via ORAL

## 2017-06-29 NOTE — Discharge Instructions (Addendum)
We will contact you with test results GO TO YOUR GYN for birth control

## 2017-06-29 NOTE — ED Provider Notes (Signed)
[facility]    CSN: [id number] Arrival date & time: 06/29/17  1223       History   Chief Complaint Chief Complaint  Patient presents with  . Dysuria    HPI Shelley Mckinney is a 27 y.o. female.   HPI  27 year old woman with history of frequent vaginal infections.  Recently treated for chlamydia.  Now has some dysuria and vaginal discharge.  No abdominal pain.  No fever chills.  She has regular menstrual periods.  She is not pregnant.  Pregnancy test today is confirmed.  She is not using birth control.  The importance of birth control if she does not desire pregnancy is reviewed with her.  She has 3 children at home.  She has a  single partner, with recurring STD issues.  Past Medical History:  Diagnosis Date  . Anemia 2014   during pregnancy  . Chlamydia   . Infection    PARTNER + CHLAMYDIA  . Infection    FREQUENT YEAST  . Infection    BV  . NSVD (normal spontaneous vaginal delivery) 08/24/2012   2311  . Pregnancy induced hypertension 2014   post partum HTN  . ROM (rupture of membranes), premature 10/25/2013  . Strep throat     Patient Active Problem List   Diagnosis Date Noted  . Term pregnancy 10/25/2013  . ROM (rupture of membranes), premature 10/25/2013  . NSVD (normal spontaneous vaginal delivery) 10/25/2013  . Mild or unspecified pre-eclampsia, with delivery 08/27/2012  . Laceration of periurethral tissue with delivery, delivered 08/24/2012  . GBS (group B Streptococcus carrier), +RV culture, currently pregnant 08/23/2012  . Spotting during pregnancy 04/26/2012  . Pregnant state, incidental 03/17/2012  . CANDIDIASIS OF VULVA AND VAGINA 05/28/2009  . ANEMIA 05/28/2009  . VAGINAL DISCHARGE 02/27/2009    Past Surgical History:  Procedure Laterality Date  . THERAPEUTIC ABORTION     x3    OB History    Gravida  7   Para  3   Term  3   Preterm      AB  3   Living  3     SAB      TAB  3   Ectopic      Multiple      Live Births   3            Home Medications    Prior to Admission medications   Medication Sig Start Date End Date Taking? Authorizing Provider  cephALEXin (KEFLEX) 500 MG capsule Take 1 capsule (500 mg total) by mouth 4 (four) times daily. 10/07/16   Maczis, Elmer Sow, PA-C  etonogestrel-ethinyl estradiol (NUVARING) 0.12-0.015 MG/24HR vaginal ring Place 1 each vaginally every 28 (twenty-eight) days. Insert vaginally and leave in place for 3 consecutive weeks, then remove for 1 week.    [provider]  fluconazole (DIFLUCAN) 150 MG tablet Take 1 tablet (150 mg total) by mouth once. 08/29/15   Aviva Signs, CNM    Family History Family History  Problem Relation Age of Onset  . Drug abuse Mother   . Drug abuse Father   . Diabetes Sister   . Down syndrome Maternal Uncle   . Cancer Paternal Uncle   . Diabetes Paternal Grandmother   . Cancer Paternal Grandfather        COLON  . Down syndrome Cousin        PATERNAL    Social History Social History   Tobacco Use  .  Smoking status: Never Smoker  . Smokeless tobacco: Never Used  Substance Use Topics  . Alcohol use: No    Comment: RARELY  . Drug use: No     Allergies   Patient has no known allergies.   Review of Systems Review of Systems  Constitutional: Negative for activity change, chills and fatigue.  HENT: Negative for congestion and dental problem.   Eyes: Negative for photophobia and visual disturbance.  Respiratory: Negative for cough and shortness of breath.   Gastrointestinal: Negative for abdominal pain, diarrhea, nausea and vomiting.  Genitourinary: Positive for dysuria, frequency and vaginal discharge. Negative for difficulty urinating, dyspareunia, pelvic pain, vaginal bleeding and vaginal pain.  Neurological: Negative for dizziness and headaches.  Psychiatric/Behavioral: Negative for sleep disturbance. The patient is not nervous/anxious.      Physical Exam Triage Vital Signs ED Triage Vitals  [06/29/17 1304]  Enc Vitals Group     BP 127/66     Pulse Rate 91     Resp 18     Temp 99 F (37.2 C)     Temp Source Oral     SpO2 100 %     Weight      Height      Head Circumference      Peak Flow      Pain Score      Pain Loc      Pain Edu?      Excl. in GC?    No data found.  Updated Vital Signs BP 127/66 (BP Location: Right Arm)   Pulse 91   Temp 99 F (37.2 C) (Oral)   Resp 18   LMP 04/06/2017 (Exact Date)   SpO2 100%   Breastfeeding? Unknown   Physical Exam  Constitutional: She appears well-developed and well-nourished. No distress.  HENT:  Head: Normocephalic and atraumatic.  Mouth/Throat: Oropharynx is clear and moist.  Eyes: Pupils are equal, round, and reactive to light. Conjunctivae are normal.  Cardiovascular: Normal rate and normal heart sounds.  Pulmonary/Chest: Effort normal and breath sounds normal.  Abdominal: Soft. Bowel sounds are normal. There is no tenderness.  Neurological: She is alert.  Psychiatric: She has a normal mood and affect. Her behavior is normal. Thought content normal.     UC Treatments / Results  Labs (all labs ordered are listed, but only abnormal results are displayed) Labs Reviewed  POCT URINALYSIS DIP (DEVICE) - Abnormal; Notable for the following components:      Result Value   Hgb urine dipstick TRACE (*)    All other components within normal limits  HIV ANTIBODY (ROUTINE TESTING)  RPR  POCT PREGNANCY, URINE    Medications Ordered in UC Medications  azithromycin (ZITHROMAX) tablet 1,000 mg (has no administration in time range)  cefTRIAXone (ROCEPHIN) injection 250 mg (has no administration in time range)    Initial Impression / Assessment and Plan / UC Course  I have reviewed the triage vital signs and the nursing notes.  Pertinent labs & imaging results that were available during my care of the patient were reviewed by me and considered in my medical decision making (see chart for details).  Clinical  Course as of Jun 30 1323  Tue Jun 29, 2017  1315 POCT Pregnancy, Urine [YN]  1315 POCT Pregnancy, Urine [YN]  1315 POCT Urinalysis, Dipstick [YN]  1315 POCT Urinalysis, Dipstick [YN]    Clinical Course User Index [YN] Eustace Moore, MD    Discussed with patient recurring STDs.  Safe sex.  The importance to go to the GYN for birth control.  Different types of birth control. Greater than 50% of this visit was spent in counseling and coordinating care.  Total face to face time:   25 minutes  Final Clinical Impressions(s) / UC Diagnoses   Final diagnoses:  None   ED Prescriptions    None     Controlled Substance Prescriptions [place] Controlled Substance Registry consulted?  Not indicated   Eustace Moore, MD 06/29/17 1622

## 2017-06-29 NOTE — ED Triage Notes (Signed)
Pt sts dysuria

## 2017-06-30 LAB — RPR: RPR Ser Ql: NONREACTIVE

## 2017-06-30 LAB — URINE CYTOLOGY ANCILLARY ONLY
Chlamydia: NEGATIVE
NEISSERIA GONORRHEA: NEGATIVE
TRICH (WINDOWPATH): NEGATIVE

## 2017-07-01 LAB — HIV ANTIBODY (ROUTINE TESTING W REFLEX): HIV Screen 4th Generation wRfx: NONREACTIVE

## 2017-07-01 NOTE — Progress Notes (Signed)
Results are within normal range. Pt contacted and made aware. Verbalized understanding.   

## 2017-07-02 LAB — URINE CYTOLOGY ANCILLARY ONLY: Candida vaginitis: NEGATIVE

## 2017-07-05 ENCOUNTER — Telehealth (HOSPITAL_COMMUNITY): Payer: Self-pay

## 2017-07-05 MED ORDER — METRONIDAZOLE 500 MG PO TABS: 500.0000 mg | ORAL_TABLET | Freq: Two times a day (BID) | ORAL | 0 refills | Status: DC

## 2017-07-05 NOTE — Telephone Encounter (Signed)
Bacterial vaginosis is positive. This was not treated at the urgent care visit.  Patient complains of persistent symptoms.  Flagyl 500 mg BID x 7 days #14 no refills sent to patients pharmacy of choice per Dr. Murray.  Pt called and made aware of results and new prescription. Answered all questions and pt verbalized understanding.  

## 2017-08-19 ENCOUNTER — Other Ambulatory Visit: Payer: Self-pay

## 2017-08-19 ENCOUNTER — Inpatient Hospital Stay (HOSPITAL_COMMUNITY)
Admission: AD | Admit: 2017-08-19 | Discharge: 2017-08-19 | Disposition: A | Discharge status: Home / Self Care | Payer: 59 | Source: Ambulatory Visit | Attending: Obstetrics and Gynecology | Admitting: Obstetrics and Gynecology

## 2017-08-19 ENCOUNTER — Encounter (HOSPITAL_COMMUNITY): Payer: Self-pay | Admitting: *Deleted

## 2017-08-19 DIAGNOSIS — Z9889 Other specified postprocedural states: Secondary | ICD-10-CM | POA: Insufficient documentation

## 2017-08-19 DIAGNOSIS — Z813 Family history of other psychoactive substance abuse and dependence: Secondary | ICD-10-CM | POA: Diagnosis not present

## 2017-08-19 DIAGNOSIS — Z800 Family history of malignant neoplasm of digestive organs: Secondary | ICD-10-CM | POA: Insufficient documentation

## 2017-08-19 DIAGNOSIS — Z833 Family history of diabetes mellitus: Secondary | ICD-10-CM | POA: Insufficient documentation

## 2017-08-19 DIAGNOSIS — N940 Mittelschmerz: Secondary | ICD-10-CM

## 2017-08-19 DIAGNOSIS — B9689 Other specified bacterial agents as the cause of diseases classified elsewhere: Secondary | ICD-10-CM

## 2017-08-19 DIAGNOSIS — N760 Acute vaginitis: Secondary | ICD-10-CM | POA: Insufficient documentation

## 2017-08-19 DIAGNOSIS — R1032 Left lower quadrant pain: Secondary | ICD-10-CM | POA: Insufficient documentation

## 2017-08-19 LAB — CBC
HEMATOCRIT: 38.8 % (ref 36.0–46.0)
HEMOGLOBIN: 12.9 g/dL (ref 12.0–15.0)
MCH: 27.7 pg (ref 26.0–34.0)
MCHC: 33.2 g/dL (ref 30.0–36.0)
MCV: 83.3 fL (ref 78.0–100.0)
Platelets: 202 10*3/uL (ref 150–400)
RBC: 4.66 MIL/uL (ref 3.87–5.11)
RDW: 12.5 % (ref 11.5–15.5)
WBC: 4.9 10*3/uL (ref 4.0–10.5)

## 2017-08-19 LAB — URINALYSIS, ROUTINE W REFLEX MICROSCOPIC
BILIRUBIN URINE: NEGATIVE
GLUCOSE, UA: NEGATIVE mg/dL
Hgb urine dipstick: NEGATIVE
KETONES UR: NEGATIVE mg/dL
Leukocytes, UA: NEGATIVE
Nitrite: NEGATIVE
PH: 7 (ref 5.0–8.0)
Protein, ur: NEGATIVE mg/dL
Specific Gravity, Urine: 1.026 (ref 1.005–1.030)

## 2017-08-19 LAB — WET PREP, GENITAL
SPERM: NONE SEEN
Trich, Wet Prep: NONE SEEN
Yeast Wet Prep HPF POC: NONE SEEN

## 2017-08-19 LAB — POCT PREGNANCY, URINE: Preg Test, Ur: NEGATIVE

## 2017-08-19 LAB — HEPATITIS B SURFACE ANTIGEN: HEP B S AG: NEGATIVE

## 2017-08-19 MED ORDER — IBUPROFEN 600 MG PO TABS: 600.0000 mg | ORAL_TABLET | Freq: Four times a day (QID) | ORAL | 0 refills | Status: DC | PRN

## 2017-08-19 MED ORDER — METRONIDAZOLE 500 MG PO TABS: 500.0000 mg | ORAL_TABLET | Freq: Two times a day (BID) | ORAL | 0 refills | Status: DC

## 2017-08-19 MED ORDER — KETOROLAC TROMETHAMINE 30 MG/ML IJ SOLN
30.0000 mg | Freq: Once | INTRAMUSCULAR | Status: AC
Start: 2017-08-19 — End: 2017-08-19
  Administered 2017-08-19: 30 mg via INTRAMUSCULAR
  Filled 2017-08-19: qty 1

## 2017-08-19 NOTE — Discharge Instructions (Signed)
Bacterial Vaginosis Bacterial vaginosis is an infection of the vagina. It happens when too many germs (bacteria) grow in the vagina. This infection puts you at risk for infections from sex (STIs). Treating this infection can lower your risk for some STIs. You should also treat this if you are pregnant. It can cause your baby to be born early. Follow these instructions at home: Medicines  Take over-the-counter and prescription medicines only as told by your doctor.  Take or use your antibiotic medicine as told by your doctor. Do not stop taking or using it even if you start to feel better. General instructions  If you your sexual partner is a woman, tell her that you have this infection. She needs to get treatment if she has symptoms. If you have a female partner, he does not need to be treated.  During treatment: ? Avoid sex. ? Do not douche. ? Avoid alcohol as told. ? Avoid breastfeeding as told.  Drink enough fluid to keep your pee (urine) clear or pale yellow.  Keep your vagina and butt (rectum) clean. ? Wash the area with warm water every day. ? Wipe from front to back after you use the toilet.  Keep all follow-up visits as told by your doctor. This is important. Preventing this condition  Do not douche.  Use only warm water to wash around your vagina.  Use protection when you have sex. This includes: ? Latex condoms. ? Dental dams.  Limit how many people you have sex with. It is best to only have sex with the same person (be monogamous).  Get tested for STIs. Have your partner get tested.  Wear underwear that is cotton or lined with cotton.  Avoid tight pants and pantyhose. This is most important in summer.  Do not use any products that have nicotine or tobacco in them. These include cigarettes and e-cigarettes. If you need help quitting, ask your doctor.  Do not use illegal drugs.  Limit how much alcohol you drink. Contact a doctor if:  Your symptoms do not get  better, even after you are treated.  You have more discharge or pain when you pee (urinate).  You have a fever.  You have pain in your belly (abdomen).  You have pain with sex.  Your bleed from your vagina between periods. Summary  This infection happens when too many germs (bacteria) grow in the vagina.  Treating this condition can lower your risk for some infections from sex (STIs).  You should also treat this if you are pregnant. It can cause early (premature) birth.  Do not stop taking or using your antibiotic medicine even if you start to feel better. This information is not intended to replace advice given to you by your health care provider. Make sure you discuss any questions you have with your health care provider. Document Released: 11/26/2007 Document Revised: 11/02/2015 Document Reviewed: 11/02/2015 Elsevier Interactive Patient Education  2017 [employer] Mittelschmerz Mittelschmerz is pain in the belly (abdomen). The pain happens between periods. It can happen with a feeling of being sick to your stomach (nausea). It can also happen with a small amount of bleeding from your vagina. Follow these instructions at home: Pay attention to any changes in your condition. Take these actions to help with your pain:  Try soaking in a hot bath.  Take over-the-counter and prescription medicines only as told by your doctor.  Keep all follow-up visits as told by your doctor. This is important.  Contact a  doctor if:  The pain is very bad most months.  The pain lasts longer than 24 hours.  Your pain medicine is not helping.  You have a fever.  You feel sick to your stomach, and the feeling does not go away.  You keep throwing up (vomiting).  You miss your period.  You have more than a small amount of blood coming from your vagina between periods. This information is not intended to replace advice given to you by your health care provider. Make sure you discuss any  questions you have with your health care provider. Document Released: 03/26/2004 Document Revised: 07/25/2015 Document Reviewed: 05/14/2014 Elsevier Interactive Patient Education  [employer].

## 2017-08-19 NOTE — MAU Note (Signed)
LLQ pain started 2 days ago. Sharp pains, comes and goes. No bleeding.  No GI or GU complaints.

## 2017-08-19 NOTE — MAU Provider Note (Addendum)
History     CSN: [id number]  Arrival date and time: 08/19/17 1045  First Provider Initiated Contact with Patient 08/19/17 1139     Chief Complaint  Patient presents with  . Abdominal Pain   27 y.o. Female here with LLQ pain. Pain started 3 days ago. Describes as intermittent and sharp. Took Ibuprofen which helped. Denies urinary sx. No fevers. No GI sx. No vaginal discharge, itching, or odor. New sexual partner x3 mos. Is not using condoms or contraception. Is not trying to conceive.   OB History    Gravida  7   Para  3   Term  3   Preterm      AB  3   Living  3     SAB      TAB  3   Ectopic      Multiple      Live Births  3           Past Medical History:  Diagnosis Date  . Anemia 2014   during pregnancy  . Chlamydia   . Infection    PARTNER + CHLAMYDIA  . Infection    FREQUENT YEAST  . Infection    BV  . NSVD (normal spontaneous vaginal delivery) 08/24/2012   2311  . Pregnancy induced hypertension 2014   post partum HTN  . ROM (rupture of membranes), premature 10/25/2013  . Strep throat     Past Surgical History:  Procedure Laterality Date  . THERAPEUTIC ABORTION     x3  . WISDOM TOOTH EXTRACTION      Family History  Problem Relation Age of Onset  . Drug abuse Mother   . Drug abuse Father   . Diabetes Sister   . Down syndrome Maternal Uncle   . Cancer Paternal Uncle   . Diabetes Paternal Grandmother   . Cancer Paternal Grandfather        COLON  . Down syndrome Cousin        PATERNAL    Social History   Tobacco Use  . Smoking status: Never Smoker  . Smokeless tobacco: Never Used  Substance Use Topics  . Alcohol use: No    Comment: RARELY  . Drug use: No    Allergies: No Known Allergies  Medications Prior to Admission  Medication Sig Dispense Refill Last Dose  . [DISCONTINUED] metroNIDAZOLE (FLAGYL) 500 MG tablet Take 1 tablet (500 mg total) by mouth 2 (two) times daily. (Patient not taking: Reported on 08/19/2017) 14  tablet 0 Not Taking at Unknown time    Review of Systems  Constitutional: Negative for fever.  Gastrointestinal: Positive for abdominal pain. Negative for constipation, diarrhea, nausea and vomiting.  Genitourinary: Negative for dysuria, frequency, hematuria, urgency, vaginal bleeding and vaginal discharge.   Physical Exam   Blood pressure 114/69, pulse 93, temperature 98.1 F (36.7 C), temperature source Oral, resp. rate 16, weight 128 lb 8 oz (58.3 kg), last menstrual period 08/05/2017, unknown if currently breastfeeding.  Physical Exam  Constitutional: She is oriented to person, place, and time. She appears well-developed and well-nourished. No distress.  HENT:  Head: Normocephalic and atraumatic.  Neck: Normal range of motion.  Cardiovascular: Normal rate.  Respiratory: Effort normal. No respiratory distress.  GI: Soft. She exhibits no distension and no mass. There is no tenderness. There is no rebound and no guarding.  Genitourinary:  Genitourinary Comments: External: no lesions or erythema Vagina: rugated, pink, moist, thick white discharge Uterus: non enlarged, anteverted, non tender, no  CMT Adnexae: no masses, no tenderness left, no tenderness right Cervix nml   Musculoskeletal: Normal range of motion.  Neurological: She is alert and oriented to person, place, and time.  Skin: Skin is warm and dry.  Psychiatric: She has a normal mood and affect.   Results for orders placed or performed during the hospital encounter of 08/19/17 (from the past 24 hour(s))  Urinalysis, Routine w reflex microscopic     Status: None   Collection Time: 08/19/17 11:18 AM  Result Value Ref Range   Color, Urine YELLOW YELLOW   APPearance CLEAR CLEAR   Specific Gravity, Urine 1.026 1.005 - 1.030   pH 7.0 5.0 - 8.0   Glucose, UA NEGATIVE NEGATIVE mg/dL   Hgb urine dipstick NEGATIVE NEGATIVE   Bilirubin Urine NEGATIVE NEGATIVE   Ketones, ur NEGATIVE NEGATIVE mg/dL   Protein, ur NEGATIVE  NEGATIVE mg/dL   Nitrite NEGATIVE NEGATIVE   Leukocytes, UA NEGATIVE NEGATIVE  Pregnancy, urine POC     Status: None   Collection Time: 08/19/17 11:22 AM  Result Value Ref Range   Preg Test, Ur NEGATIVE NEGATIVE  Wet prep, genital     Status: Abnormal   Collection Time: 08/19/17 11:51 AM  Result Value Ref Range   Yeast Wet Prep HPF POC NONE SEEN NONE SEEN   Trich, Wet Prep NONE SEEN NONE SEEN   Clue Cells Wet Prep HPF POC PRESENT (A) NONE SEEN   WBC, Wet Prep HPF POC FEW (A) NONE SEEN   Sperm NONE SEEN   CBC     Status: None   Collection Time: 08/19/17 12:10 PM  Result Value Ref Range   WBC 4.9 4.0 - 10.5 K/uL   RBC 4.66 3.87 - 5.11 MIL/uL   Hemoglobin 12.9 12.0 - 15.0 g/dL   HCT 16.138.8 09.636.0 - 04.546.0 %   MCV 83.3 78.0 - 100.0 fL   MCH 27.7 26.0 - 34.0 pg   MCHC 33.2 30.0 - 36.0 g/dL   RDW 40.912.5 81.111.5 - 91.415.5 %   Platelets 202 150 - 400 K/uL   MAU Course  Procedures Toradol  MDM Labs ordered and reviewed. Pain improved. No evidence of acute abdominal or pelvic process. Will treat BV. Cultures pending. Presentation, clinical findings, and plan discussed with Dr. Ellyn HackBovard. Stable for discharge home.  Assessment and Plan   1. Mittelschmerz   2. Bacterial vaginosis    Discharge home Follow up with Dr. Mindi SlickerBanga as needed Rx Flagyl Rx Ibuprofen  Allergies as of 08/19/2017   No Known Allergies     Medication List    TAKE these medications   ibuprofen 600 MG tablet Commonly known as:  ADVIL,MOTRIN Take 1 tablet (600 mg total) by mouth every 6 (six) hours as needed.   metroNIDAZOLE 500 MG tablet Commonly known as:  FLAGYL Take 1 tablet (500 mg total) by mouth 2 (two) times daily.      Donette LarryMelanie Shedrick Sarli, CNM 08/19/2017, 1:05 PM

## 2017-08-20 LAB — GC/CHLAMYDIA PROBE AMP (~~LOC~~) NOT AT ARMC
Chlamydia: NEGATIVE
Neisseria Gonorrhea: NEGATIVE

## 2017-08-20 LAB — HIV ANTIBODY (ROUTINE TESTING W REFLEX): HIV Screen 4th Generation wRfx: NONREACTIVE

## 2017-08-20 LAB — RPR: RPR: NONREACTIVE

## 2017-08-24 ENCOUNTER — Other Ambulatory Visit: Payer: Self-pay | Admitting: Certified Nurse Midwife

## 2017-08-28 ENCOUNTER — Other Ambulatory Visit: Payer: Self-pay | Admitting: Certified Nurse Midwife

## 2017-09-01 ENCOUNTER — Other Ambulatory Visit: Payer: Self-pay | Admitting: Certified Nurse Midwife

## 2017-09-05 ENCOUNTER — Other Ambulatory Visit: Payer: Self-pay | Admitting: Certified Nurse Midwife

## 2017-09-09 ENCOUNTER — Other Ambulatory Visit: Payer: Self-pay | Admitting: Certified Nurse Midwife

## 2017-09-13 ENCOUNTER — Other Ambulatory Visit: Payer: Self-pay | Admitting: Certified Nurse Midwife

## 2017-09-17 ENCOUNTER — Other Ambulatory Visit: Payer: Self-pay | Admitting: Certified Nurse Midwife

## 2017-09-21 ENCOUNTER — Other Ambulatory Visit: Payer: Self-pay | Admitting: Certified Nurse Midwife

## 2017-09-25 ENCOUNTER — Other Ambulatory Visit: Payer: Self-pay | Admitting: Certified Nurse Midwife

## 2017-12-28 ENCOUNTER — Encounter (HOSPITAL_COMMUNITY): Payer: Self-pay | Admitting: Emergency Medicine

## 2017-12-28 ENCOUNTER — Ambulatory Visit (HOSPITAL_COMMUNITY)
Admission: EM | Admit: 2017-12-28 | Discharge: 2017-12-28 | Disposition: A | Discharge status: Home / Self Care | Payer: 59 | Attending: Family Medicine | Admitting: Family Medicine

## 2017-12-28 DIAGNOSIS — B9689 Other specified bacterial agents as the cause of diseases classified elsewhere: Secondary | ICD-10-CM

## 2017-12-28 DIAGNOSIS — N760 Acute vaginitis: Secondary | ICD-10-CM

## 2017-12-28 MED ORDER — METRONIDAZOLE 500 MG PO TABS: 500.0000 mg | ORAL_TABLET | Freq: Two times a day (BID) | ORAL | 0 refills | Status: DC

## 2017-12-28 NOTE — ED Provider Notes (Signed)
[facility]    CSN: [id number] Arrival date & time: 12/28/17  4098     History   Chief Complaint Chief Complaint  Patient presents with  . Vaginal Discharge    HPI Shelley Mckinney is a 27 y.o. female.   HPI  Patient has recurrent BV.  She is here with symptoms of BV.  Mild vaginal irritation, definite itching.  She is monogamous.  She denies need for any STD testing.  No abdominal pain.  No dysuria or frequency.  No fever or chills.  She has normal menses.  Last menstrual period was last week.  Denies need for pregnancy testing.  Past Medical History:  Diagnosis Date  . Anemia 2014   during pregnancy  . Chlamydia   . Infection    PARTNER + CHLAMYDIA  . Infection    FREQUENT YEAST  . Infection    BV  . NSVD (normal spontaneous vaginal delivery) 08/24/2012   2311  . Pregnancy induced hypertension 2014   post partum HTN  . ROM (rupture of membranes), premature 10/25/2013  . Strep throat     Patient Active Problem List   Diagnosis Date Noted  . Term pregnancy 10/25/2013  . ROM (rupture of membranes), premature 10/25/2013  . NSVD (normal spontaneous vaginal delivery) 10/25/2013  . Mild or unspecified pre-eclampsia, with delivery 08/27/2012  . Laceration of periurethral tissue with delivery, delivered 08/24/2012  . GBS (group B Streptococcus carrier), +RV culture, currently pregnant 08/23/2012  . Spotting during pregnancy 04/26/2012  . Pregnant state, incidental 03/17/2012  . CANDIDIASIS OF VULVA AND VAGINA 05/28/2009  . ANEMIA 05/28/2009  . VAGINAL DISCHARGE 02/27/2009    Past Surgical History:  Procedure Laterality Date  . THERAPEUTIC ABORTION     x3  . WISDOM TOOTH EXTRACTION      OB History    Gravida  7   Para  3   Term  3   Preterm      AB  3   Living  3     SAB      TAB  3   Ectopic      Multiple      Live Births  3            Home Medications    Prior to Admission medications   Medication Sig Start Date End  Date Taking? Authorizing Provider  ibuprofen (ADVIL,MOTRIN) 600 MG tablet Take 1 tablet (600 mg total) by mouth every 6 (six) hours as needed. 08/19/17   Donette Larry, CNM  metroNIDAZOLE (FLAGYL) 500 MG tablet Take 1 tablet (500 mg total) by mouth 2 (two) times daily. 12/28/17   Eustace Moore, MD    Family History Family History  Problem Relation Age of Onset  . Drug abuse Mother   . Drug abuse Father   . Diabetes Sister   . Down syndrome Maternal Uncle   . Cancer Paternal Uncle   . Diabetes Paternal Grandmother   . Cancer Paternal Grandfather        COLON  . Down syndrome Cousin        PATERNAL    Social History Social History   Tobacco Use  . Smoking status: Never Smoker  . Smokeless tobacco: Never Used  Substance Use Topics  . Alcohol use: No    Comment: RARELY  . Drug use: No     Allergies   Patient has no known allergies.   Review of Systems Review of Systems  Constitutional: Negative  for chills and fever.  HENT: Negative for ear pain and sore throat.   Eyes: Negative for pain and visual disturbance.  Respiratory: Negative for cough and shortness of breath.   Cardiovascular: Negative for chest pain and palpitations.  Gastrointestinal: Negative for abdominal pain and vomiting.  Genitourinary: Positive for vaginal discharge. Negative for dysuria and hematuria.  Musculoskeletal: Negative for arthralgias and back pain.  Skin: Negative for color change and rash.  Neurological: Negative for seizures and syncope.  All other systems reviewed and are negative.    Physical Exam Triage Vital Signs ED Triage Vitals [12/28/17 0826]  Enc Vitals Group     BP 118/64     Pulse Rate 88     Resp 16     Temp 97.9 F (36.6 C)     Temp Source Oral     SpO2 100 %     Weight      Height      Head Circumference      Peak Flow      Pain Score 0     Pain Loc      Pain Edu?      Excl. in GC?    No data found.  Updated Vital Signs BP 118/64 (BP Location:  Right Arm)   Pulse 88   Temp 97.9 F (36.6 C) (Oral)   Resp 16   LMP 12/19/2017   SpO2 100%   Visual Acuity Right Eye Distance:   Left Eye Distance:   Bilateral Distance:    Right Eye Near:   Left Eye Near:    Bilateral Near:     Physical Exam  Constitutional: She appears well-developed and well-nourished. No distress.  HENT:  Head: Normocephalic and atraumatic.  Mouth/Throat: Oropharynx is clear and moist.  Eyes: Pupils are equal, round, and reactive to light. Conjunctivae are normal.  Neck: Normal range of motion.  Cardiovascular: Normal rate.  Pulmonary/Chest: Effort normal. No respiratory distress.  Abdominal: Soft. She exhibits no distension.  Musculoskeletal: Normal range of motion. She exhibits no edema.  Neurological: She is alert.  Skin: Skin is warm and dry.     UC Treatments / Results  Labs (all labs ordered are listed, but only abnormal results are displayed) Labs Reviewed - No data to display  EKG None  Radiology No results found.  Procedures Procedures (including critical care time)  Medications Ordered in UC Medications - No data to display  Initial Impression / Assessment and Plan / UC Course  I have reviewed the triage vital signs and the nursing notes.  Pertinent labs & imaging results that were available during my care of the patient were reviewed by me and considered in my medical decision making (see chart for details).     Discussed recurring BV.  Discussed not using any soap lotion powder or product in the genital area that could be an irritant.  Mild soap only as needed.  No douching.  Written information is given.  Possible benefit from probiotic discussed Final Clinical Impressions(s) / UC Diagnoses   Final diagnoses:  BV (bacterial vaginosis)     Discharge Instructions     Read the BV instructions Take the antibiotic for 7 full days Avoid sexual relations until treatment complete Return as needed   ED Prescriptions      Medication Sig Dispense Auth. Provider   metroNIDAZOLE (FLAGYL) 500 MG tablet Take 1 tablet (500 mg total) by mouth 2 (two) times daily. 14 tablet Eustace Moore, MD  Controlled Substance Prescriptions  Controlled Substance Registry consulted? Not Applicable   Eustace Moore, MD 12/28/17 [phone]

## 2017-12-28 NOTE — Discharge Instructions (Signed)
Read the BV instructions Take the antibiotic for 7 full days Avoid sexual relations until treatment complete Return as needed

## 2017-12-28 NOTE — ED Triage Notes (Signed)
PT reports vaginal odor and discharge for 2 days. Has had BV before and this is similar. No dysuria

## 2018-03-30 ENCOUNTER — Ambulatory Visit (HOSPITAL_COMMUNITY)
Admission: EM | Admit: 2018-03-30 | Discharge: 2018-03-30 | Disposition: A | Discharge status: Home / Self Care | Payer: 59 | Attending: Family Medicine | Admitting: Family Medicine

## 2018-03-30 ENCOUNTER — Encounter (HOSPITAL_COMMUNITY): Payer: Self-pay

## 2018-03-30 DIAGNOSIS — R300 Dysuria: Secondary | ICD-10-CM | POA: Insufficient documentation

## 2018-03-30 LAB — POCT URINALYSIS DIP (DEVICE)
Glucose, UA: NEGATIVE mg/dL
KETONES UR: NEGATIVE mg/dL
Nitrite: NEGATIVE
PROTEIN: 100 mg/dL — AB
Specific Gravity, Urine: >=1.03 (ref 1.005–1.030)
Urobilinogen, UA: 0.2 mg/dL (ref 0.0–1.0)
pH: 5.5 (ref 5.0–8.0)

## 2018-03-30 MED ORDER — NITROFURANTOIN MONOHYD MACRO 100 MG PO CAPS: 100.0000 mg | ORAL_CAPSULE | Freq: Two times a day (BID) | ORAL | 0 refills | Status: AC

## 2018-03-30 NOTE — Discharge Instructions (Signed)
Urine showed evidence of infection. We are treating you with macrobid twice daily for 5 days. Be sure to take full course. Stay hydrated- urine should be pale yellow to clear. May use azo for relief of burning while infection is being cleared.   Please return or follow up with your primary provider if symptoms not improving with treatment. Please return sooner if you have worsening of symptoms or develop fever, nausea, vomiting, abdominal pain, back pain, lightheadedness, dizziness.

## 2018-03-30 NOTE — ED Triage Notes (Signed)
Pt c/o frequent urination with pain, states feels like an UTI

## 2018-03-30 NOTE — ED Provider Notes (Signed)
[facility]    CSN: [id number] Arrival date & time: 03/30/18  4158     History   Chief Complaint Chief Complaint  Patient presents with  . Urinary Tract Infection    HPI Shelley Mckinney is a 28 y.o. female no contributing past medical history presenting today for evaluation of possible UTI.  Patient states that she has had increased frequency, dysuria and hematuria.  Symptoms began over the past 1 to 2 days.  She has had 1 previous UTI when she was pregnant.  She denies any vaginal symptoms of discharge, itching or irritation.  Last menstrual period was 1/15.  Denies fever, nausea, vomiting, abdominal pain or back pain.  HPI  Past Medical History:  Diagnosis Date  . Anemia 2014   during pregnancy  . Chlamydia   . Infection    PARTNER + CHLAMYDIA  . Infection    FREQUENT YEAST  . Infection    BV  . NSVD (normal spontaneous vaginal delivery) 08/24/2012   2311  . Pregnancy induced hypertension 2014   post partum HTN  . ROM (rupture of membranes), premature 10/25/2013  . Strep throat     Patient Active Problem List   Diagnosis Date Noted  . Term pregnancy 10/25/2013  . ROM (rupture of membranes), premature 10/25/2013  . NSVD (normal spontaneous vaginal delivery) 10/25/2013  . Mild or unspecified pre-eclampsia, with delivery 08/27/2012  . Laceration of periurethral tissue with delivery, delivered 08/24/2012  . GBS (group B Streptococcus carrier), +RV culture, currently pregnant 08/23/2012  . Spotting during pregnancy 04/26/2012  . Pregnant state, incidental 03/17/2012  . CANDIDIASIS OF VULVA AND VAGINA 05/28/2009  . ANEMIA 05/28/2009  . VAGINAL DISCHARGE 02/27/2009    Past Surgical History:  Procedure Laterality Date  . THERAPEUTIC ABORTION     x3  . WISDOM TOOTH EXTRACTION      OB History    Gravida  7   Para  3   Term  3   Preterm      AB  3   Living  3     SAB      TAB  3   Ectopic      Multiple      Live Births  3              Home Medications    Prior to Admission medications   Medication Sig Start Date End Date Taking? Authorizing Provider  ibuprofen (ADVIL,MOTRIN) 600 MG tablet Take 1 tablet (600 mg total) by mouth every 6 (six) hours as needed. 08/19/17   Donette Larry, CNM  nitrofurantoin, macrocrystal-monohydrate, (MACROBID) 100 MG capsule Take 1 capsule (100 mg total) by mouth 2 (two) times daily for 5 days. 03/30/18 04/04/18  Wieters, Junius Creamer, PA-C    Family History Family History  Problem Relation Age of Onset  . Drug abuse Mother   . Drug abuse Father   . Diabetes Sister   . Down syndrome Maternal Uncle   . Cancer Paternal Uncle   . Diabetes Paternal Grandmother   . Cancer Paternal Grandfather        COLON  . Down syndrome Cousin        PATERNAL    Social History Social History   Tobacco Use  . Smoking status: Never Smoker  . Smokeless tobacco: Never Used  Substance Use Topics  . Alcohol use: No    Comment: RARELY  . Drug use: No     Allergies   Patient has  no known allergies.   Review of Systems Review of Systems  Constitutional: Negative for fever.  Respiratory: Negative for shortness of breath.   Cardiovascular: Negative for chest pain.  Gastrointestinal: Negative for abdominal pain, diarrhea, nausea and vomiting.  Genitourinary: Positive for dysuria, frequency and hematuria. Negative for flank pain, genital sores, menstrual problem, vaginal bleeding, vaginal discharge and vaginal pain.  Musculoskeletal: Negative for back pain.  Skin: Negative for rash.  Neurological: Negative for dizziness, light-headedness and headaches.     Physical Exam Triage Vital Signs ED Triage Vitals  Enc Vitals Group     BP 03/30/18 0847 (!) 94/54     Pulse Rate 03/30/18 0847 84     Resp 03/30/18 0847 18     Temp 03/30/18 0847 98.7 F (37.1 C)     Temp Source 03/30/18 0847 Oral     SpO2 03/30/18 0847 98 %     Weight --      Height --      Head Circumference --      Peak Flow  --      Pain Score 03/30/18 0848 0     Pain Loc --      Pain Edu? --      Excl. in GC? --    No data found.  Updated Vital Signs BP (!) 94/54 (BP Location: Right Arm)   Pulse 84   Temp 98.7 F (37.1 C) (Oral)   Resp 18   LMP 03/16/2018   SpO2 98%   Visual Acuity Right Eye Distance:   Left Eye Distance:   Bilateral Distance:    Right Eye Near:   Left Eye Near:    Bilateral Near:     Physical Exam Vitals signs and nursing note reviewed.  Constitutional:      General: She is not in acute distress.    Appearance: She is well-developed.  HENT:     Head: Normocephalic and atraumatic.  Eyes:     Conjunctiva/sclera: Conjunctivae normal.  Neck:     Musculoskeletal: Neck supple.  Cardiovascular:     Rate and Rhythm: Normal rate and regular rhythm.     Heart sounds: No murmur.  Pulmonary:     Effort: Pulmonary effort is normal. No respiratory distress.     Breath sounds: Normal breath sounds.  Abdominal:     Palpations: Abdomen is soft.     Tenderness: There is no abdominal tenderness.     Comments: Abdomen soft, nondistended, nontender to light deep palpation throughout all 4 quadrants and suprapubic area, no CVA tenderness  Skin:    General: Skin is warm and dry.  Neurological:     Mental Status: She is alert.      UC Treatments / Results  Labs (all labs ordered are listed, but only abnormal results are displayed) Labs Reviewed  POCT URINALYSIS DIP (DEVICE) - Abnormal; Notable for the following components:      Result Value   Bilirubin Urine SMALL (*)    Hgb urine dipstick LARGE (*)    Protein, ur 100 (*)    Leukocytes, UA SMALL (*)    All other components within normal limits  URINE CULTURE    EKG None  Radiology No results found.  Procedures Procedures (including critical care time)  Medications Ordered in UC Medications - No data to display  Initial Impression / Assessment and Plan / UC Course  I have reviewed the triage vital signs and the  nursing notes.  Pertinent labs & imaging  results that were available during my care of the patient were reviewed by me and considered in my medical decision making (see chart for details).     Small leuks on UA, will send off for culture to confirm as well as check sensitivities.  Will initiate treatment for UTI with Macrobid twice daily x5 days.  Push fluids.  Continue to monitor symptoms,Discussed strict return precautions. Patient verbalized understanding and is agreeable with plan.  Final Clinical Impressions(s) / UC Diagnoses   Final diagnoses:  Dysuria     Discharge Instructions     Urine showed evidence of infection. We are treating you with macrobid twice daily for 5 days. Be sure to take full course. Stay hydrated- urine should be pale yellow to clear. May use azo for relief of burning while infection is being cleared.   Please return or follow up with your primary provider if symptoms not improving with treatment. Please return sooner if you have worsening of symptoms or develop fever, nausea, vomiting, abdominal pain, back pain, lightheadedness, dizziness.   ED Prescriptions    Medication Sig Dispense Auth. Provider   nitrofurantoin, macrocrystal-monohydrate, (MACROBID) 100 MG capsule Take 1 capsule (100 mg total) by mouth 2 (two) times daily for 5 days. 10 capsule Wieters, Hallie C, PA-C     Controlled Substance Prescriptions [place] Controlled Substance Registry consulted? Not Applicable   Lew DawesWieters, Hallie C, [place]PA-C 03/30/18 [phone]

## 2018-03-31 LAB — URINE CULTURE: Culture: NO GROWTH

## 2018-05-20 ENCOUNTER — Other Ambulatory Visit: Payer: Self-pay | Admitting: Certified Nurse Midwife

## 2018-06-05 ENCOUNTER — Ambulatory Visit (HOSPITAL_COMMUNITY)
Admission: EM | Admit: 2018-06-05 | Discharge: 2018-06-05 | Disposition: A | Discharge status: Home / Self Care | Payer: 59 | Attending: Family Medicine | Admitting: Family Medicine

## 2018-06-05 ENCOUNTER — Encounter (HOSPITAL_COMMUNITY): Payer: Self-pay | Admitting: Family Medicine

## 2018-06-05 ENCOUNTER — Other Ambulatory Visit: Payer: Self-pay

## 2018-06-05 DIAGNOSIS — N760 Acute vaginitis: Secondary | ICD-10-CM

## 2018-06-05 LAB — POCT PREGNANCY, URINE: Preg Test, Ur: NEGATIVE

## 2018-06-05 LAB — POCT URINALYSIS DIP (DEVICE)
Bilirubin Urine: NEGATIVE
Glucose, UA: NEGATIVE mg/dL
Ketones, ur: NEGATIVE mg/dL
Leukocytes,Ua: NEGATIVE
Nitrite: NEGATIVE
Protein, ur: NEGATIVE mg/dL
Specific Gravity, Urine: 1.025 (ref 1.005–1.030)
Urobilinogen, UA: 0.2 mg/dL (ref 0.0–1.0)
pH: 7.5 (ref 5.0–8.0)

## 2018-06-05 MED ORDER — METRONIDAZOLE 500 MG PO TABS: 500.0000 mg | ORAL_TABLET | Freq: Two times a day (BID) | ORAL | 0 refills | Status: DC

## 2018-06-05 NOTE — Discharge Instructions (Signed)
From your history and the testing, the vaginitis appears to be BV.  The STD testing should be back in 2 days and we will call you ONLY if there is any positive test.

## 2018-06-05 NOTE — ED Provider Notes (Signed)
[facility]    CSN: [id number] Arrival date & time: 06/05/18  1429     History   Chief Complaint Chief Complaint  Patient presents with  . Vaginal Discharge    HPI Shelley Mckinney is a 28 y.o. female.   28 yo established [facility] patient presents with complaint of vaginal discharge.  This has been present for three days.  LMP was three weeks ago.  Not contracepting.  G3P3.    She believes this is BV.    No abdominal, back, urinary symptoms.  No fever.     Past Medical History:  Diagnosis Date  . Anemia 2014   during pregnancy  . Chlamydia   . Infection    PARTNER + CHLAMYDIA  . Infection    FREQUENT YEAST  . Infection    BV  . NSVD (normal spontaneous vaginal delivery) 08/24/2012   2311  . Pregnancy induced hypertension 2014   post partum HTN  . ROM (rupture of membranes), premature 10/25/2013  . Strep throat     Patient Active Problem List   Diagnosis Date Noted  . Term pregnancy 10/25/2013  . ROM (rupture of membranes), premature 10/25/2013  . NSVD (normal spontaneous vaginal delivery) 10/25/2013  . Mild or unspecified pre-eclampsia, with delivery 08/27/2012  . Laceration of periurethral tissue with delivery, delivered 08/24/2012  . GBS (group B Streptococcus carrier), +RV culture, currently pregnant 08/23/2012  . Spotting during pregnancy 04/26/2012  . Pregnant state, incidental 03/17/2012  . CANDIDIASIS OF VULVA AND VAGINA 05/28/2009  . ANEMIA 05/28/2009  . VAGINAL DISCHARGE 02/27/2009    Past Surgical History:  Procedure Laterality Date  . THERAPEUTIC ABORTION     x3  . WISDOM TOOTH EXTRACTION      OB History    Gravida  7   Para  3   Term  3   Preterm      AB  3   Living  3     SAB      TAB  3   Ectopic      Multiple      Live Births  3            Home Medications    Prior to Admission medications   Medication Sig Start Date End Date Taking? Authorizing Provider  ibuprofen (ADVIL,MOTRIN) 600 MG tablet Take  1 tablet (600 mg total) by mouth every 6 (six) hours as needed. 08/19/17   Donette Larry, CNM  metroNIDAZOLE (FLAGYL) 500 MG tablet Take 1 tablet (500 mg total) by mouth 2 (two) times daily. 06/05/18   Elvina Sidle, MD    Family History Family History  Problem Relation Age of Onset  . Drug abuse Mother   . Drug abuse Father   . Diabetes Sister   . Down syndrome Maternal Uncle   . Cancer Paternal Uncle   . Diabetes Paternal Grandmother   . Cancer Paternal Grandfather        COLON  . Down syndrome Cousin        PATERNAL    Social History Social History   Tobacco Use  . Smoking status: Never Smoker  . Smokeless tobacco: Never Used  Substance Use Topics  . Alcohol use: No    Comment: RARELY  . Drug use: No     Allergies   Patient has no known allergies.   Review of Systems Review of Systems   Physical Exam Triage Vital Signs ED Triage Vitals  Enc Vitals Group  BP      Pulse      Resp      Temp      Temp src      SpO2      Weight      Height      Head Circumference      Peak Flow      Pain Score      Pain Loc      Pain Edu?      Excl. in GC?    No data found.  Updated Vital Signs BP 115/78 (BP Location: Right Arm)   Pulse 98   Temp 98.7 F (37.1 C) (Oral)   Resp 18   SpO2 99%    Physical Exam Vitals signs and nursing note reviewed.  Constitutional:      Appearance: Normal appearance. She is obese.  HENT:     Head: Normocephalic.     Mouth/Throat:     Mouth: Mucous membranes are moist.  Eyes:     Conjunctiva/sclera: Conjunctivae normal.  Neck:     Musculoskeletal: Normal range of motion and neck supple.  Cardiovascular:     Rate and Rhythm: Normal rate.  Pulmonary:     Effort: Pulmonary effort is normal.  Musculoskeletal: Normal range of motion.  Skin:    General: Skin is warm and dry.  Neurological:     General: No focal deficit present.     Mental Status: She is alert and oriented to person, place, and time.      UC  Treatments / Results  Labs (all labs ordered are listed, but only abnormal results are displayed) Labs Reviewed  POCT URINALYSIS DIP (DEVICE) - Abnormal; Notable for the following components:      Result Value   Hgb urine dipstick TRACE (*)    All other components within normal limits  POC URINE PREG, ED  POCT PREGNANCY, URINE  CERVICOVAGINAL ANCILLARY ONLY    EKG None  Radiology No results found.  Procedures Procedures (including critical care time)  Medications Ordered in UC Medications - No data to display  Initial Impression / Assessment and Plan / UC Course  I have reviewed the triage vital signs and the nursing notes.  Pertinent labs & imaging results that were available during my care of the patient were reviewed by me and considered in my medical decision making (see chart for details).    Final Clinical Impressions(s) / UC Diagnoses   Final diagnoses:  Acute vaginitis     Discharge Instructions     From your history and the testing, the vaginitis appears to be BV.  The STD testing should be back in 2 days and we will call you ONLY if there is any positive test.    ED Prescriptions    Medication Sig Dispense Auth. Provider   metroNIDAZOLE (FLAGYL) 500 MG tablet Take 1 tablet (500 mg total) by mouth 2 (two) times daily. 14 tablet Elvina Sidle, MD     Controlled Substance Prescriptions [place] Controlled Substance Registry consulted? Not Applicable   Elvina Sidle, MD 06/05/18 1506

## 2018-06-05 NOTE — ED Triage Notes (Signed)
Pt here for vaginal discharge  

## 2018-06-06 LAB — CERVICOVAGINAL ANCILLARY ONLY
Chlamydia: NEGATIVE
Neisseria Gonorrhea: NEGATIVE
Trichomonas: NEGATIVE

## 2018-10-30 ENCOUNTER — Encounter (HOSPITAL_COMMUNITY): Payer: Self-pay

## 2018-10-30 ENCOUNTER — Inpatient Hospital Stay (HOSPITAL_COMMUNITY): Payer: 59

## 2018-10-30 ENCOUNTER — Inpatient Hospital Stay (HOSPITAL_COMMUNITY)
Admission: AD | Admit: 2018-10-30 | Discharge: 2018-10-30 | Disposition: A | Discharge status: Home / Self Care | Payer: 59 | Attending: Obstetrics and Gynecology | Admitting: Obstetrics and Gynecology

## 2018-10-30 ENCOUNTER — Other Ambulatory Visit: Payer: Self-pay

## 2018-10-30 DIAGNOSIS — O26891 Other specified pregnancy related conditions, first trimester: Secondary | ICD-10-CM | POA: Diagnosis not present

## 2018-10-30 DIAGNOSIS — Z3491 Encounter for supervision of normal pregnancy, unspecified, first trimester: Secondary | ICD-10-CM

## 2018-10-30 DIAGNOSIS — Z6791 Unspecified blood type, Rh negative: Secondary | ICD-10-CM | POA: Insufficient documentation

## 2018-10-30 DIAGNOSIS — O360910 Maternal care for other rhesus isoimmunization, first trimester, not applicable or unspecified: Secondary | ICD-10-CM

## 2018-10-30 DIAGNOSIS — Z560 Unemployment, unspecified: Secondary | ICD-10-CM | POA: Diagnosis not present

## 2018-10-30 DIAGNOSIS — Z3A01 Less than 8 weeks gestation of pregnancy: Secondary | ICD-10-CM | POA: Diagnosis not present

## 2018-10-30 DIAGNOSIS — R109 Unspecified abdominal pain: Secondary | ICD-10-CM | POA: Diagnosis not present

## 2018-10-30 DIAGNOSIS — O209 Hemorrhage in early pregnancy, unspecified: Secondary | ICD-10-CM | POA: Diagnosis present

## 2018-10-30 DIAGNOSIS — O418X10 Other specified disorders of amniotic fluid and membranes, first trimester, not applicable or unspecified: Secondary | ICD-10-CM | POA: Diagnosis not present

## 2018-10-30 DIAGNOSIS — O161 Unspecified maternal hypertension, first trimester: Secondary | ICD-10-CM | POA: Insufficient documentation

## 2018-10-30 DIAGNOSIS — O468X1 Other antepartum hemorrhage, first trimester: Secondary | ICD-10-CM

## 2018-10-30 LAB — URINALYSIS, ROUTINE W REFLEX MICROSCOPIC
Bacteria, UA: NONE SEEN
Bilirubin Urine: NEGATIVE
Glucose, UA: NEGATIVE mg/dL
Ketones, ur: NEGATIVE mg/dL
Leukocytes,Ua: NEGATIVE
Nitrite: NEGATIVE
Protein, ur: NEGATIVE mg/dL
Specific Gravity, Urine: 1.029 (ref 1.005–1.030)
pH: 5 (ref 5.0–8.0)

## 2018-10-30 LAB — CBC
HCT: 36.5 % (ref 36.0–46.0)
Hemoglobin: 12 g/dL (ref 12.0–15.0)
MCH: 27.2 pg (ref 26.0–34.0)
MCHC: 32.9 g/dL (ref 30.0–36.0)
MCV: 82.8 fL (ref 80.0–100.0)
Platelets: UNDETERMINED 10*3/uL (ref 150–400)
RBC: 4.41 MIL/uL (ref 3.87–5.11)
RDW: 12.1 % (ref 11.5–15.5)
WBC: 5.8 10*3/uL (ref 4.0–10.5)
nRBC: 0 % (ref 0.0–0.2)

## 2018-10-30 LAB — POCT PREGNANCY, URINE: Preg Test, Ur: POSITIVE — AB

## 2018-10-30 LAB — WET PREP, GENITAL
Clue Cells Wet Prep HPF POC: NONE SEEN
Sperm: NONE SEEN
Trich, Wet Prep: NONE SEEN
Yeast Wet Prep HPF POC: NONE SEEN

## 2018-10-30 LAB — HCG, QUANTITATIVE, PREGNANCY: hCG, Beta Chain, Quant, S: 119777 m[IU]/mL — ABNORMAL HIGH (ref <5)

## 2018-10-30 MED ORDER — RHO D IMMUNE GLOBULIN 1500 UNIT/2ML IJ SOSY
300.0000 ug | PREFILLED_SYRINGE | Freq: Once | INTRAMUSCULAR | Status: AC
  Administered 2018-10-30: 300 ug via INTRAMUSCULAR
  Filled 2018-10-30: qty 2

## 2018-10-30 NOTE — MAU Provider Note (Signed)
Chief Complaint: Vaginal Bleeding   First Provider Initiated Contact with Patient 10/30/18 1242     SUBJECTIVE HPI: Shelley Mckinney is a 28 y.o. [id number] at Unknown who presents to Maternity Admissions reporting abdominal cramping & vaginal bleeding. Had TAB 6/27; had a negative pregnancy test the end of July. Earlier this month she went to her ob [facility] & had labs done for a new pregnancy. States her hormone went from 7000 to 16000. Has not had an ultrasound. Is scheduled for f/u in the office on 9/11. Current symptoms started last night. Reports mild cramping that occurred last night and resolved without intervention. This morning noticed bright red blood running down her legs. Bleeding has slowed down since then. Is not saturating pads and did not pass clots. No recent intercourse.   Past Medical History:  Diagnosis Date  . Anemia 2014   during pregnancy  . Chlamydia   . Infection    PARTNER + CHLAMYDIA  . Infection    FREQUENT YEAST  . Infection    BV  . NSVD (normal spontaneous vaginal delivery) 08/24/2012   2311  . Pregnancy induced hypertension 2014   post partum HTN  . ROM (rupture of membranes), premature 10/25/2013  . Strep throat    OB History  Gravida Para Term Preterm AB Living  8 3 3   3 3   SAB TAB Ectopic Multiple Live Births    3     3    # Outcome Date GA Lbr Len/2nd Weight Sex Delivery Anes PTL Lv  8 Current           7 Term 10/25/13 7037w1d 06:20 / 00:17 2885 g F Vag-Spont EPI  LIV  6 Term 08/24/12 3867w3d / 00:09 3033 g M Vag-Spont EPI  LIV     Birth Comments: anemia, htn after pregnancy  5 TAB 2012 6248w0d         4 TAB 2012 9248w0d         3 TAB 2011 7648w0d         2 Term 01/2007 6867w3d 14:00 2807 g M Vag-Spont EPI N LIV     Birth Comments: NO COMP; RHOGm  1 Gravida            Past Surgical History:  Procedure Laterality Date  . THERAPEUTIC ABORTION     x3  . WISDOM TOOTH EXTRACTION     Social History   Socioeconomic History  . Marital status:  Single    Spouse name: Not on file  . Number of children: Not on file  . Years of education: 7313  . Highest education level: Not on file  Occupational History  . Occupation: Hydrologist[employer]    Employer: UNEMPLOYED  Social Needs  . Financial resource strain: Not on file  . Food insecurity    Worry: Not on file    Inability: Not on file  . Transportation needs    Medical: Not on file    Non-medical: Not on file  Tobacco Use  . Smoking status: Never Smoker  . Smokeless tobacco: Never Used  Substance and Sexual Activity  . Alcohol use: No    Comment: RARELY  . Drug use: No  . Sexual activity: Yes    Partners: Male    Birth control/protection: None  Lifestyle  . Physical activity    Days per week: Not on file    Minutes per session: Not on file  . Stress: Not on file  Relationships  .  Social Musician on phone: Not on file    Gets together: Not on file    Attends religious service: Not on file    Active member of club or organization: Not on file    Attends meetings of clubs or organizations: Not on file    Relationship status: Not on file  . Intimate partner violence    Fear of current or ex partner: Not on file    Emotionally abused: Not on file    Physically abused: Not on file    Forced sexual activity: Not on file  Other Topics Concern  . Not on file  Social History Narrative  . Not on file   Family History  Problem Relation Age of Onset  . Drug abuse Mother   . Drug abuse Father   . Diabetes Sister   . Down syndrome Maternal Uncle   . Cancer Paternal Uncle   . Diabetes Paternal Grandmother   . Cancer Paternal Grandfather        COLON  . Down syndrome Cousin        PATERNAL   No current facility-administered medications on file prior to encounter.    Current Outpatient Medications on File Prior to Encounter  Medication Sig Dispense Refill  . ibuprofen (ADVIL,MOTRIN) 600 MG tablet Take 1 tablet (600 mg total) by mouth every 6 (six) hours as needed. 30  tablet 0  . metroNIDAZOLE (FLAGYL) 500 MG tablet Take 1 tablet (500 mg total) by mouth 2 (two) times daily. 14 tablet 0   No Known Allergies  I have reviewed patient's Past Medical Hx, Surgical Hx, Family Hx, Social Hx, medications and allergies.   Review of Systems  Constitutional: Negative.   Gastrointestinal: Positive for abdominal pain (resolved).  Genitourinary: Positive for vaginal bleeding.    OBJECTIVE Patient Vitals for the past 24 hrs:  BP Temp Temp src Pulse Resp SpO2 Height Weight  10/30/18 1220 105/63 - - 81 - - - -  10/30/18 1136 114/63 98.4 F (36.9 C) Oral 83 16 100 % 4\' 10"  (1.473 m) 60.5 kg   Constitutional: Well-developed, well-nourished female in no acute distress.  Cardiovascular: normal rate & rhythm, no murmur Respiratory: normal rate and effort. Lung sounds clear throughout GI: Abd soft, non-tender, Pos BS x 4. No guarding or rebound tenderness MS: Extremities nontender, no edema, normal ROM Neurologic: Alert and oriented x 4.  GU:     SPECULUM EXAM: NEFG, small amount of dark red blood  BIMANUAL: No CMT. cervix closed; uterus normal size, no adnexal tenderness or masses.    LAB RESULTS Results for orders placed or performed during the hospital encounter of 10/30/18 (from the past 24 hour(s))  Pregnancy, urine POC     Status: Abnormal   Collection Time: 10/30/18 11:32 AM  Result Value Ref Range   Preg Test, Ur POSITIVE (A) NEGATIVE  Urinalysis, Routine w reflex microscopic     Status: Abnormal   Collection Time: 10/30/18 11:36 AM  Result Value Ref Range   Color, Urine YELLOW YELLOW   APPearance CLEAR CLEAR   Specific Gravity, Urine 1.029 1.005 - 1.030   pH 5.0 5.0 - 8.0   Glucose, UA NEGATIVE NEGATIVE mg/dL   Hgb urine dipstick MODERATE (A) NEGATIVE   Bilirubin Urine NEGATIVE NEGATIVE   Ketones, ur NEGATIVE NEGATIVE mg/dL   Protein, ur NEGATIVE NEGATIVE mg/dL   Nitrite NEGATIVE NEGATIVE   Leukocytes,Ua NEGATIVE NEGATIVE   RBC / HPF 21-50 0 - 5  RBC/hpf   WBC, UA 0-5 0 - 5 WBC/hpf   Bacteria, UA NONE SEEN NONE SEEN   Squamous Epithelial / LPF 0-5 0 - 5   Mucus PRESENT   CBC     Status: None   Collection Time: 10/30/18  1:08 PM  Result Value Ref Range   WBC 5.8 4.0 - 10.5 K/uL   RBC 4.41 3.87 - 5.11 MIL/uL   Hemoglobin 12.0 12.0 - 15.0 g/dL   HCT 16.136.5 09.636.0 - 04.546.0 %   MCV 82.8 80.0 - 100.0 fL   MCH 27.2 26.0 - 34.0 pg   MCHC 32.9 30.0 - 36.0 g/dL   RDW 40.912.1 81.111.5 - 91.415.5 %   Platelets PLATELET CLUMPS NOTED ON SMEAR, UNABLE TO ESTIMATE 150 - 400 K/uL   nRBC 0.0 0.0 - 0.2 %  hCG, quantitative, pregnancy     Status: Abnormal   Collection Time: 10/30/18  1:08 PM  Result Value Ref Range   hCG, Beta Chain, Quant, S 119,777 (H) <5 mIU/mL  Rh IG workup (includes ABO/Rh)     Status: None (Preliminary result)   Collection Time: 10/30/18  1:08 PM  Result Value Ref Range   Gestational Age(Wks) 6    ABO/RH(D) B NEG    Antibody Screen NEG    Unit Number [id number]    Blood Component Type RHIG    Unit division 00    Status of Unit ISSUED    Transfusion Status      OK TO TRANSFUSE Performed at [facility], [address] [place], [place] [address]   Wet prep, genital     Status: Abnormal   Collection Time: 10/30/18  1:20 PM  Result Value Ref Range   Yeast Wet Prep HPF POC NONE SEEN NONE SEEN   Trich, Wet Prep NONE SEEN NONE SEEN   Clue Cells Wet Prep HPF POC NONE SEEN NONE SEEN   WBC, Wet Prep HPF POC MODERATE (A) NONE SEEN   Sperm NONE SEEN     IMAGING No results found.  MAU COURSE Orders Placed This Encounter  Procedures  . Wet prep, genital  . US MFM OB Comp Less 14 Wks  . Urinalysis, Routine w reflex microscopic  . CBC  . hCG, quantitative, pregnancy  . Pregnancy, urine POC  . Rh IG workup (includes ABO/Rh)  . Discharge patient   Meds ordered this encounter  Medications  . rho (d) immune globulin (RHIG/RHOPHYLAC) injection 300 mcg    MDM +UPT UA, wet prep, GC/chlamydia, CBC, ABO/Rh, quant hCG,  and US today to rule out ectopic pregnancy  RH negative. Rhogam given today in MAU  Ultrasound shows live IUP with moderate SCH. Discussed results with patient & bleeding expectations. Also discussed risk of miscarriage.   ASSESSMENT 1. Subchorionic hematoma in first trimester, single or unspecified fetus   2. Vaginal bleeding in pregnancy, first trimester   3. Normal IUP (intrauterine pregnancy) on prenatal ultrasound, first trimester   4. Rh negative status during pregnancy in first trimester     PLAN Discharge home in stable condition. Bleeding precautions GC/CT pending  Follow-up Information    [facility] Follow up.   Specialty: Obstetrics and Gynecology Why: return for worsening symptoms Contact information: [address] [id number] [place] [id number] [phone]         Allergies as of 10/30/2018   No Known Allergies     Medication List    STOP taking these medications  ibuprofen 600 MG tablet Commonly known as: ADVIL   metroNIDAZOLE 500 MG tablet Commonly known as: Georgiann Mohs, NP 10/30/2018  3:54 PM

## 2018-10-30 NOTE — MAU Note (Signed)
Shelley Mckinney is a 28 y.o. here in MAU reporting: had some cramping last night and saw bleeding this AM. Not having any more cramping. States the bleeding this AM was heavier then it is currently, bleeding ran down her leg earlier now she describes it as scant on her pad.  LMP: unknown, states she had TAB on 06/27 and has not had a period since then  Onset of complaint: last night  Pain score: 0/10  Vitals:   10/30/18 1136  BP: 114/63  Pulse: 83  Resp: 16  Temp: 98.4 F (36.9 C)  SpO2: 100%      Lab orders placed from triage: UA, UPT

## 2018-10-30 NOTE — Discharge Instructions (Signed)
Subchorionic Hematoma ° °A subchorionic hematoma is a gathering of blood between the outer wall of the embryo (chorion) and the inner wall of the womb (uterus). °This condition can cause vaginal bleeding. If they cause little or no vaginal bleeding, early small hematomas usually shrink on their own and do not affect your baby or pregnancy. When bleeding starts later in pregnancy, or if the hematoma is larger or occurs in older pregnant women, the condition may be more serious. Larger hematomas may get bigger, which increases the chances of miscarriage. This condition also increases the risk of: °· Premature separation of the placenta from the uterus. °· Premature (preterm) labor. °· Stillbirth. °What are the causes? °The exact cause of this condition is not known. It occurs when blood is trapped between the placenta and the uterine wall because the placenta has separated from the original site of implantation. °What increases the risk? °You are more likely to develop this condition if: °· You were treated with fertility medicines. °· You conceived through in vitro fertilization (IVF). °What are the signs or symptoms? °Symptoms of this condition include: °· Vaginal spotting or bleeding. °· Contractions of the uterus. These cause abdominal pain. °Sometimes you may have no symptoms and the bleeding may only be seen when ultrasound images are taken (transvaginal ultrasound). °How is this diagnosed? °This condition is diagnosed based on a physical exam. This includes a pelvic exam. You may also have other tests, including: °· Blood tests. °· Urine tests. °· Ultrasound of the abdomen. °How is this treated? °Treatment for this condition can vary. Treatment may include: °· Watchful waiting. You will be monitored closely for any changes in bleeding. During this stage: °? The hematoma may be reabsorbed by the body. °? The hematoma may separate the fluid-filled space containing the embryo (gestational sac) from the wall of the  womb (endometrium). °· Medicines. °· Activity restriction. This may be needed until the bleeding stops. °Follow these instructions at home: °· Stay on bed rest if told to do so by your health care provider. °· Do not lift anything that is heavier than 10 lbs. (4.5 kg) or as told by your health care provider. °· Do not use any products that contain nicotine or tobacco, such as cigarettes and e-cigarettes. If you need help quitting, ask your health care provider. °· Track and write down the number of pads you use each day and how soaked (saturated) they are. °· Do not use tampons. °· Keep all follow-up visits as told by your health care provider. This is important. Your health care provider may ask you to have follow-up blood tests or ultrasound tests or both. °Contact a health care provider if: °· You have any vaginal bleeding. °· You have a fever. °Get help right away if: °· You have severe cramps in your stomach, back, abdomen, or pelvis. °· You pass large clots or tissue. Save any tissue for your health care provider to look at. °· You have more vaginal bleeding, and you faint or become lightheaded or weak. °Summary °· A subchorionic hematoma is a gathering of blood between the outer wall of the placenta and the uterus. °· This condition can cause vaginal bleeding. °· Sometimes you may have no symptoms and the bleeding may only be seen when ultrasound images are taken. °· Treatment may include watchful waiting, medicines, or activity restriction. °This information is not intended to replace advice given to you by your health care provider. Make sure you discuss any questions you   have with your health care provider. °Document Released: 06/03/2006 Document Revised: 01/29/2017 Document Reviewed: 04/14/2016 °Elsevier Patient Education © 2020 [employer] ° °

## 2018-10-31 LAB — RH IG WORKUP (INCLUDES ABO/RH)
ABO/RH(D): B NEG
Antibody Screen: NEGATIVE
Gestational Age(Wks): 6
Unit division: 0

## 2018-11-01 LAB — GC/CHLAMYDIA PROBE AMP (~~LOC~~) NOT AT ARMC
Chlamydia: NEGATIVE
Neisseria Gonorrhea: NEGATIVE

## 2019-04-21 ENCOUNTER — Telehealth: Payer: Self-pay | Admitting: Nurse Practitioner

## 2019-04-21 NOTE — Telephone Encounter (Signed)
Received a new hem referral from [facility] for thrombocytopenia. Shelley Mckinney has been cld and schedule to see Lacie on 2/22 at 1:45pm. Pt aware to arrive 15 minutes early.

## 2019-04-24 ENCOUNTER — Inpatient Hospital Stay: Payer: 59 | Attending: Nurse Practitioner | Admitting: Nurse Practitioner

## 2019-04-24 ENCOUNTER — Other Ambulatory Visit: Payer: Self-pay

## 2019-04-24 ENCOUNTER — Telehealth: Payer: Self-pay | Admitting: Nurse Practitioner

## 2019-04-24 ENCOUNTER — Inpatient Hospital Stay: Payer: 59

## 2019-04-24 ENCOUNTER — Encounter: Payer: Self-pay | Admitting: Nurse Practitioner

## 2019-04-24 VITALS — BP 112/51 | HR 94 | Temp 98.7°F | Resp 9 | Ht <= 58 in | Wt 154.7 lb

## 2019-04-24 DIAGNOSIS — O26893 Other specified pregnancy related conditions, third trimester: Secondary | ICD-10-CM | POA: Insufficient documentation

## 2019-04-24 DIAGNOSIS — D509 Iron deficiency anemia, unspecified: Secondary | ICD-10-CM | POA: Insufficient documentation

## 2019-04-24 DIAGNOSIS — O99013 Anemia complicating pregnancy, third trimester: Secondary | ICD-10-CM | POA: Diagnosis present

## 2019-04-24 DIAGNOSIS — Z3A32 32 weeks gestation of pregnancy: Secondary | ICD-10-CM | POA: Insufficient documentation

## 2019-04-24 DIAGNOSIS — D696 Thrombocytopenia, unspecified: Secondary | ICD-10-CM

## 2019-04-24 LAB — CMP (CANCER CENTER ONLY)
ALT: 12 U/L (ref 0–44)
AST: 13 U/L — ABNORMAL LOW (ref 15–41)
Albumin: 2.9 g/dL — ABNORMAL LOW (ref 3.5–5.0)
Alkaline Phosphatase: 120 U/L (ref 38–126)
Anion gap: 9 (ref 5–15)
BUN: 6 mg/dL (ref 6–20)
CO2: 21 mmol/L — ABNORMAL LOW (ref 22–32)
Calcium: 9 mg/dL (ref 8.9–10.3)
Chloride: 109 mmol/L (ref 98–111)
Creatinine: 0.58 mg/dL (ref 0.44–1.00)
GFR, Est AFR Am: >60 mL/min (ref >60)
GFR, Estimated: >60 mL/min (ref >60)
Glucose, Bld: 77 mg/dL (ref 70–99)
Potassium: 4.4 mmol/L (ref 3.5–5.1)
Sodium: 139 mmol/L (ref 135–145)
Total Bilirubin: 0.3 mg/dL (ref 0.3–1.2)
Total Protein: 7 g/dL (ref 6.5–8.1)

## 2019-04-24 LAB — CBC WITH DIFFERENTIAL (CANCER CENTER ONLY)
Abs Immature Granulocytes: 0.1 10*3/uL — ABNORMAL HIGH (ref 0.00–0.07)
Basophils Absolute: 0 10*3/uL (ref 0.0–0.1)
Basophils Relative: 0 %
Eosinophils Absolute: 0.1 10*3/uL (ref 0.0–0.5)
Eosinophils Relative: 1 %
HCT: 29.4 % — ABNORMAL LOW (ref 36.0–46.0)
Hemoglobin: 9.1 g/dL — ABNORMAL LOW (ref 12.0–15.0)
Immature Granulocytes: 1 %
Lymphocytes Relative: 15 %
Lymphs Abs: 1.2 10*3/uL (ref 0.7–4.0)
MCH: 23.9 pg — ABNORMAL LOW (ref 26.0–34.0)
MCHC: 31 g/dL (ref 30.0–36.0)
MCV: 77.2 fL — ABNORMAL LOW (ref 80.0–100.0)
Monocytes Absolute: 0.9 10*3/uL (ref 0.1–1.0)
Monocytes Relative: 10 %
Neutro Abs: 6.1 10*3/uL (ref 1.7–7.7)
Neutrophils Relative %: 73 %
Platelet Count: 182 10*3/uL (ref 150–400)
RBC: 3.81 MIL/uL — ABNORMAL LOW (ref 3.87–5.11)
RDW: 14.3 % (ref 11.5–15.5)
WBC Count: 8.4 10*3/uL (ref 4.0–10.5)
nRBC: 0 % (ref 0.0–0.2)

## 2019-04-24 LAB — RETIC PANEL
Immature Retic Fract: 34.8 % — ABNORMAL HIGH (ref 2.3–15.9)
RBC.: 3.76 MIL/uL — ABNORMAL LOW (ref 3.87–5.11)
Retic Count, Absolute: 75.6 10*3/uL (ref 19.0–186.0)
Retic Ct Pct: 2 % (ref 0.4–3.1)
Reticulocyte Hemoglobin: 23.6 pg — ABNORMAL LOW (ref >27.9)

## 2019-04-24 LAB — PLATELET BY CITRATE

## 2019-04-24 LAB — FERRITIN: Ferritin: 33 ng/mL (ref 11–307)

## 2019-04-24 NOTE — Progress Notes (Addendum)
[facility]  Telephone:(336) [phone] Fax:(336) [phone]  Clinic New consult Note   Patient Care Team: [facility] as PCP - General Date of Service: 04/24/2019   CHIEF COMPLAINTS/PURPOSE OF CONSULTATION:  Thrombocytopenia, referred by Ob/Gyn Dr. Reina Fuse  HISTORY OF PRESENTING ILLNESS:  Shelley Mckinney 29 y.o. female at [redacted] weeks pregnant referred here by Ob for thrombocytopenia. She was found to have thrombocytopenia PLT 49K and anemia Hgb 9.5 on 03/17/2019. She has history of anemia prior to this pregnancy, on oral iron Fusion 1 tab daily plus prenatal vitamin. Repeat PLT on 03/21/19 was 126K. Labs on 04/13/19 showed Hgb 8.7 with low MCV 77; platelet could not be counted due to aggregation. She has no prior history of thrombocytopenia. She is G9P3, 4 induced abortions and 1 miscarriage in the first trimester. She had pre-eclampsia with HTN after delivering second child, on anti-hypertensive post-partum then discontinued. She is otherwise healthy, no h/o DVT, PE, autoimmune disease, clotting disorder, or liver disease. She never required anticoagulation. No family history of blood disorder.   Socially, she lives with her 3 children (2 sons and a daughter). She works for [employer]. She is independent of all ADLs. She denies history of alcohol, tobacco, or drug use.   Today, she presents to clinic by herself. She feels ok. Has low energy level that fluctuates. She works and remains functional, has 3 children at home. She had usual morning sickness but no recent n/v. Bowels moving normally. Denies bleeding. No recent fever, chills, cough, chest pain, dyspnea, or leg swelling. She had periodic headaches and dizziness around 5 months into pregnancy, but better lately. Denies any pain including RUQ pain.    MEDICAL HISTORY:  Past Medical History:  Diagnosis Date  . Anemia 2014   during pregnancy  . Chlamydia   . Infection    PARTNER + CHLAMYDIA  . Infection     FREQUENT YEAST  . Infection    BV  . NSVD (normal spontaneous vaginal delivery) 08/24/2012   2311  . Pregnancy induced hypertension 2014   post partum HTN  . ROM (rupture of membranes), premature 10/25/2013  . Strep throat     SURGICAL HISTORY: Past Surgical History:  Procedure Laterality Date  . THERAPEUTIC ABORTION     x3  . WISDOM TOOTH EXTRACTION      SOCIAL HISTORY: Social History   Socioeconomic History  . Marital status: Significant Other    Spouse name: Not on file  . Number of children: Not on file  . Years of education: 11  . Highest education level: Not on file  Occupational History  . Occupation: Hydrologist: UNEMPLOYED  Tobacco Use  . Smoking status: Never Smoker  . Smokeless tobacco: Never Used  Substance and Sexual Activity  . Alcohol use: No    Comment: RARELY  . Drug use: No  . Sexual activity: Yes    Partners: Male    Birth control/protection: None  Other Topics Concern  . Not on file  Social History Narrative  . Not on file   Social Determinants of Health   Financial Resource Strain:   . Difficulty of Paying Living Expenses: Not on file  Food Insecurity:   . Worried About Programme researcher, broadcasting/film/video in the Last Year: Not on file  . Ran Out of Food in the Last Year: Not on file  Transportation Needs:   . Lack of Transportation (Medical): Not on file  . Lack of Transportation (  Non-Medical): Not on file  Physical Activity:   . Days of Exercise per Week: Not on file  . Minutes of Exercise per Session: Not on file  Stress:   . Feeling of Stress : Not on file  Social Connections:   . Frequency of Communication with Friends and Family: Not on file  . Frequency of Social Gatherings with Friends and Family: Not on file  . Attends Religious Services: Not on file  . Active Member of Clubs or Organizations: Not on file  . Attends Archivist Meetings: Not on file  . Marital Status: Not on file  Intimate Partner Violence:   . Fear of  Current or Ex-Partner: Not on file  . Emotionally Abused: Not on file  . Physically Abused: Not on file  . Sexually Abused: Not on file    FAMILY HISTORY: Family History  Problem Relation Age of Onset  . Drug abuse Mother   . Drug abuse Father   . Diabetes Sister   . Down syndrome Maternal Uncle   . Cancer Paternal Uncle   . Diabetes Paternal Grandmother   . Cancer Paternal Grandfather        COLON  . Down syndrome Cousin        PATERNAL    ALLERGIES:  has No Known Allergies.  MEDICATIONS:  Current Outpatient Medications  Medication Sig Dispense Refill  . Fe Fum-Fe Poly-Vit C-Lactobac (FUSION PO) Take 130 mg by mouth daily. Fusion 130 mg iron-25 mg-30 mg capsule    . Prenatal Vit-Fe Fumarate-FA (PRENATAL VITAMIN) 27-0.8 MG TABS Prenatal Vitamin     No current facility-administered medications for this visit.    REVIEW OF SYSTEMS:   Constitutional: Denies fevers, chills or abnormal night sweats (+) fatigue  Eyes: Denies blurriness of vision, double vision or watery eyes Ears, nose, mouth, throat, and face: Denies mucositis or sore throat Respiratory: Denies cough, dyspnea or wheezes Cardiovascular: Denies palpitation, chest discomfort or lower extremity swelling Gastrointestinal:  Denies constipation, diarrhea, heartburn or change in bowel habits (+) morning sickness, resolved  Skin: Denies abnormal skin rashes Lymphatics: Denies new lymphadenopathy or easy bruising Neurological:Denies numbness, tingling or new weaknesses (+) periodic headache, dizziness, improved  Behavioral/Psych: Mood is stable, no new changes  All other systems were reviewed with the patient and are negative.  PHYSICAL EXAMINATION:  Vitals:   04/24/19 1348  BP: (!) 112/51  Pulse: 94  Resp: (!) 9  Temp: 98.7 F (37.1 C)  SpO2: 97%   Filed Weights   04/24/19 1348  Weight: 154 lb 11.2 oz (70.2 kg)    GENERAL:alert, no distress and comfortable SKIN: no rash  EYES: sclera clear LUNGS:  clear with normal breathing effort HEART: regular rate & rhythm, no lower extremity edema PSYCH: alert & oriented x 3 with fluent speech NEURO: no focal motor/sensory deficits  LABORATORY DATA:  I have reviewed the data as listed CBC Latest Ref Rng & Units 04/24/2019 10/30/2018 08/19/2017  WBC 4.0 - 10.5 K/uL 8.4 5.8 4.9  Hemoglobin 12.0 - 15.0 g/dL 9.1(L) 12.0 12.9  Hematocrit 36.0 - 46.0 % 29.4(L) 36.5 38.8  Platelets 150 - 400 K/uL 182 PLATELET CLUMPS NOTED ON SMEAR, UNABLE TO ESTIMATE 202    CMP Latest Ref Rng & Units 04/24/2019 08/31/2014 08/26/2012  Glucose 70 - 99 mg/dL 77 101(H) 99  BUN 6 - 20 mg/dL 6 <5(L) 10  Creatinine 0.44 - 1.00 mg/dL 0.58 0.50 0.56  Sodium 135 - 145 mmol/L 139 136 136  Potassium  3.5 - 5.1 mmol/L 4.4 3.3(L) 4.2  Chloride 98 - 111 mmol/L 109 107 103  CO2 22 - 32 mmol/L 21(L) 21(L) 25  Calcium 8.9 - 10.3 mg/dL 9.0 5.4(O) 8.9  Total Protein 6.5 - 8.1 g/dL 7.0 - 5.5(L)  Total Bilirubin 0.3 - 1.2 mg/dL 0.3 - 2.7(O)  Alkaline Phos 38 - 126 U/L 120 - 120(H)  AST 15 - 41 U/L 13(L) - 17  ALT 0 - 44 U/L 12 - 11     RADIOGRAPHIC STUDIES: I have personally reviewed the radiological images as listed and agreed with the findings in the report. No results found.  ASSESSMENT & PLAN: 28 yof at [redacted] weeks gestation [id number]  1. Anemia  -we reviewed her medical record in detail, she has history of IDA.  -She is taking oral iron (Fusion) once daily and prenatal vitamin.  -Today's labs show ferritin 33, serum iron 29, TIBC 620, 5% transferrin saturation. She has mild iron deficiency. No signs of bleeding.  -We are recommending for her to increase oral iron to 2-3 times daily if she can tolerate.  -She was encouraged to take iron with orange juice/vitamin D to promote absorption  -she will return for lab and f/u in 6 weeks to monitor her response to oral iron.  -continue routine f/u with Ob as well  2. Thrombocytopenia in pregnancy  -PLT on 03/17/19 was 49K with clumped  platelets, repeated on 03/21/19 it was 126K, no signs of bleeding -we reviewed certain causes of thrombocytopenia in pregnancy -repeat CBC and plt in citrate at this consult shows normal platelet count of 182K.  -She is not hypertensive, no lab evidence of pre-eclampsia or HELLP syndrome  -Will check plt in citrate in the future.   PLAN: -Labs reviewed -Increase oral iron to 2-3 times daily, take with vitamin C -Lab, f/u in 6 weeks  -Continue f/u with Ob   No problem-specific Assessment & Plan notes found for this encounter.    All questions were answered. The patient knows to call the clinic with any problems, questions or concerns.     Pollyann Samples, NP 04/26/19    Addendum  I have seen the patient, examined her. I agree with the assessment and and plan and have edited the notes.   Shelley Mckinney is a 29 yo female, at 32 weeks pregnancy, referred by OB for anemia and thrombocytopenia. She denies recent bleeding. She is on oral iron. Due to her plt clumping on previous CBC, I am not sure if she has true thrombocytopenia. Repeated lab today showed normal plt, mild anemia with Hg 9.1, and mild iron deficiency. Continue oral iron, increase dose if she can tolerates, and we will see her back in 6 weeks. She will f/u with her OB closely and call us if needed.   Malachy Mood  04/24/2019

## 2019-04-24 NOTE — Telephone Encounter (Signed)
Lab add on per 2/22 los

## 2019-04-25 ENCOUNTER — Telehealth: Payer: Self-pay | Admitting: *Deleted

## 2019-04-25 LAB — IRON AND TIBC
Iron: 29 ug/dL — ABNORMAL LOW (ref 41–142)
Saturation Ratios: 5 % — ABNORMAL LOW (ref 21–57)
TIBC: 620 ug/dL — ABNORMAL HIGH (ref 236–444)
UIBC: 592 ug/dL — ABNORMAL HIGH (ref 120–384)

## 2019-04-25 NOTE — Telephone Encounter (Signed)
-----   Message from Pollyann Samples, NP sent at 04/25/2019  4:34 PM EST ----- Please let her know platelet count from 2/22 is normal, may have been a lab error before because her platelets were clumped. She does have mild iron deficiency anemia. I recommend to increase oral iron to 2-3 times per day, take with orange juice. We will repeat lab and f/u in 6 weeks. Continue prenatal vitamin and f/u with Ob as scheduled in the meantime.  Thanks, Clayborn Heron

## 2019-04-25 NOTE — Telephone Encounter (Signed)
Called pt & informed of lab results per Santiago Glad NP & need to increase iron & f/u & lab in 6 wks. Message to scheduler.  Pt states she will try to increase but sometimes makes her nauseated.

## 2019-04-26 ENCOUNTER — Other Ambulatory Visit: Payer: Self-pay

## 2019-04-26 ENCOUNTER — Telehealth: Payer: Self-pay | Admitting: Nurse Practitioner

## 2019-04-26 ENCOUNTER — Encounter: Payer: Self-pay | Admitting: Nurse Practitioner

## 2019-04-26 ENCOUNTER — Encounter (HOSPITAL_COMMUNITY): Payer: Self-pay

## 2019-04-26 ENCOUNTER — Ambulatory Visit (HOSPITAL_COMMUNITY)
Admission: EM | Admit: 2019-04-26 | Discharge: 2019-04-26 | Disposition: A | Discharge status: Home / Self Care | Payer: 59 | Attending: Family Medicine | Admitting: Family Medicine

## 2019-04-26 DIAGNOSIS — Z20822 Contact with and (suspected) exposure to covid-19: Secondary | ICD-10-CM | POA: Insufficient documentation

## 2019-04-26 DIAGNOSIS — Z833 Family history of diabetes mellitus: Secondary | ICD-10-CM | POA: Insufficient documentation

## 2019-04-26 DIAGNOSIS — H9201 Otalgia, right ear: Secondary | ICD-10-CM | POA: Diagnosis not present

## 2019-04-26 DIAGNOSIS — J029 Acute pharyngitis, unspecified: Secondary | ICD-10-CM | POA: Insufficient documentation

## 2019-04-26 LAB — POCT RAPID STREP A: Streptococcus, Group A Screen (Direct): NEGATIVE

## 2019-04-26 NOTE — Telephone Encounter (Signed)
Scheduled appt per 2/23 sch message - mailed letter with appt date and time   

## 2019-04-26 NOTE — Discharge Instructions (Addendum)
Your COVID test is pending.  You should self quarantine until your test result is back and is negative.    Take Tylenol as needed for fever or discomfort.  Rest and keep yourself hydrated.    Go to the emergency department if you develop high fever, shortness of breath, severe diarrhea, or other concerning symptoms.    

## 2019-04-26 NOTE — ED Provider Notes (Signed)
[facility]    CSN: [id number] Arrival date & time: 04/26/19  1093      History   Chief Complaint Chief Complaint  Patient presents with  . Sore Throat    HPI Shelley Mckinney is a 29 y.o. female.   Patient reports that she has had pain for the last 3 days.  Denies sick contacts.  Has not tried any attempted treatment.  Denies fever, chills, body aches, sinus pain, cough, chest tightness, headache, rash, other symptoms.  ROS per HPI  The history is provided by the patient.  Sore Throat    Past Medical History:  Diagnosis Date  . Anemia 2014   during pregnancy  . Chlamydia   . Infection    PARTNER + CHLAMYDIA  . Infection    FREQUENT YEAST  . Infection    BV  . NSVD (normal spontaneous vaginal delivery) 08/24/2012   2311  . Pregnancy induced hypertension 2014   post partum HTN  . ROM (rupture of membranes), premature 10/25/2013  . Strep throat     Patient Active Problem List   Diagnosis Date Noted  . Term pregnancy 10/25/2013  . ROM (rupture of membranes), premature 10/25/2013  . NSVD (normal spontaneous vaginal delivery) 10/25/2013  . Mild or unspecified pre-eclampsia, with delivery 08/27/2012  . Laceration of periurethral tissue with delivery, delivered 08/24/2012  . GBS (group B Streptococcus carrier), +RV culture, currently pregnant 08/23/2012  . Spotting during pregnancy 04/26/2012  . Pregnant state, incidental 03/17/2012  . CANDIDIASIS OF VULVA AND VAGINA 05/28/2009  . ANEMIA 05/28/2009  . VAGINAL DISCHARGE 02/27/2009    Past Surgical History:  Procedure Laterality Date  . THERAPEUTIC ABORTION     x3  . WISDOM TOOTH EXTRACTION      OB History    Gravida  8   Para  3   Term  3   Preterm      AB  3   Living  3     SAB      TAB  3   Ectopic      Multiple      Live Births  3            Home Medications    Prior to Admission medications   Medication Sig Start Date End Date Taking? Authorizing Provider  Fe  Fum-Fe Poly-Vit C-Lactobac (FUSION PO) Take 130 mg by mouth daily. Fusion 130 mg iron-25 mg-30 mg capsule    [provider]  Prenatal Vit-Fe Fumarate-FA (PRENATAL VITAMIN) 27-0.8 MG TABS Prenatal Vitamin    [provider]    Family History Family History  Problem Relation Age of Onset  . Drug abuse Mother   . Drug abuse Father   . Diabetes Sister   . Down syndrome Maternal Uncle   . Cancer Paternal Uncle   . Diabetes Paternal Grandmother   . Cancer Paternal Grandfather        COLON  . Down syndrome Cousin        PATERNAL    Social History Social History   Tobacco Use  . Smoking status: Never Smoker  . Smokeless tobacco: Never Used  Substance Use Topics  . Alcohol use: No    Comment: RARELY  . Drug use: No     Allergies   Patient has no known allergies.   Review of Systems Review of Systems   Physical Exam Triage Vital Signs ED Triage Vitals [04/26/19 0847]  Enc Vitals Group  BP 110/73     Pulse Rate 100     Resp 16     Temp 98.5 F (36.9 C)     Temp Source Oral     SpO2 100 %     Weight 154 lb (69.9 kg)     Height      Head Circumference      Peak Flow      Pain Score      Pain Loc      Pain Edu?      Excl. in GC?    No data found.  Updated Vital Signs BP 110/73 (BP Location: Right Arm)   Pulse 100   Temp 98.5 F (36.9 C) (Oral)   Resp 16   Wt 154 lb (69.9 kg)   LMP  (LMP Unknown)   SpO2 100%   BMI 32.19 kg/m       Physical Exam Vitals and nursing note reviewed.  Constitutional:      General: She is not in acute distress.    Appearance: She is well-developed.  HENT:     Head: Normocephalic and atraumatic.     Right Ear: Tympanic membrane normal.     Left Ear: Tympanic membrane normal.     Nose: Congestion present.     Mouth/Throat:     Mouth: Mucous membranes are moist.     Pharynx: Posterior oropharyngeal erythema present.     Tonsils: No tonsillar exudate or tonsillar abscesses.  Eyes:      Conjunctiva/sclera: Conjunctivae normal.  Cardiovascular:     Rate and Rhythm: Normal rate and regular rhythm.     Heart sounds: Normal heart sounds. No murmur.  Pulmonary:     Effort: Pulmonary effort is normal. No respiratory distress.     Breath sounds: Normal breath sounds. No stridor. No wheezing, rhonchi or rales.  Abdominal:     Tenderness: There is no abdominal tenderness.     Comments: Gravid  Musculoskeletal:     Cervical back: Neck supple.  Skin:    General: Skin is warm and dry.     Capillary Refill: Capillary refill takes less than 2 seconds.  Neurological:     General: No focal deficit present.     Mental Status: She is alert and oriented to person, place, and time.  Psychiatric:        Mood and Affect: Mood normal.        Behavior: Behavior normal.      UC Treatments / Results  Labs (all labs ordered are listed, but only abnormal results are displayed) Labs Reviewed  CULTURE, GROUP A STREP ([facility])  NOVEL CORONAVIRUS, NAA (HOSP ORDER, SEND-OUT TO REF LAB; TAT 18-24 HRS)  POCT RAPID STREP A    EKG   Radiology No results found.  Procedures Procedures (including critical care time)  Medications Ordered in UC Medications - No data to display  Initial Impression / Assessment and Plan / UC Course  I have reviewed the triage vital signs and the nursing notes.  Pertinent labs & imaging results that were available during my care of the patient were reviewed by me and considered in my medical decision making (see chart for details).     Presents with pharyngitis x3 days.  Rapid strep in office was negative.  Will culture, and inform patient of results.  Will treat if necessary.  Covid test pending, will inform patient of results.  Instructed to quarantine until results are back and negative.  Instructed when follow-up  with primary care.  Instructed on when to go to the ER. Final Clinical Impressions(s) / UC Diagnoses   Final diagnoses:  Otalgia of right ear   Viral pharyngitis     Discharge Instructions     Your COVID test is pending.  You should self quarantine until your test result is back and is negative.    Take Tylenol as needed for fever or discomfort.  Rest and keep yourself hydrated.    Go to the emergency department if you develop high fever, shortness of breath, severe diarrhea, or other concerning symptoms.       ED Prescriptions    None     I have reviewed the PDMP during this encounter.   Moshe Cipro, NP 04/26/19 [phone]

## 2019-04-26 NOTE — ED Triage Notes (Signed)
Pt states she has a sore throat x 3 days.

## 2019-04-28 LAB — CULTURE, GROUP A STREP (THRC)

## 2019-04-28 LAB — NOVEL CORONAVIRUS, NAA (HOSP ORDER, SEND-OUT TO REF LAB; TAT 18-24 HRS): SARS-CoV-2, NAA: NOT DETECTED

## 2019-05-01 ENCOUNTER — Telehealth (HOSPITAL_COMMUNITY): Payer: Self-pay | Admitting: Emergency Medicine

## 2019-05-01 MED ORDER — AMOXICILLIN 875 MG PO TABS: 875.0000 mg | ORAL_TABLET | Freq: Two times a day (BID) | ORAL | 0 refills | Status: AC

## 2019-05-01 NOTE — Telephone Encounter (Signed)
Strep culture positive. Per Dr. Tracie Harrier, send amoxicillin BID x10 days. Patient contacted by phone and made aware of    results. Pt verbalized understanding and had all questions answered.

## 2019-05-16 ENCOUNTER — Other Ambulatory Visit: Payer: Self-pay

## 2019-05-16 ENCOUNTER — Inpatient Hospital Stay (HOSPITAL_COMMUNITY)
Admission: AD | Admit: 2019-05-16 | Discharge: 2019-05-16 | Disposition: A | Discharge status: Home / Self Care | Payer: 59 | Attending: Obstetrics and Gynecology | Admitting: Obstetrics and Gynecology

## 2019-05-16 DIAGNOSIS — Z3A36 36 weeks gestation of pregnancy: Secondary | ICD-10-CM | POA: Insufficient documentation

## 2019-05-16 DIAGNOSIS — Z3A35 35 weeks gestation of pregnancy: Secondary | ICD-10-CM

## 2019-05-16 DIAGNOSIS — O3433 Maternal care for cervical incompetence, third trimester: Secondary | ICD-10-CM

## 2019-05-16 LAB — OB RESULTS CONSOLE GBS: GBS: NEGATIVE

## 2019-05-16 MED ORDER — BETAMETHASONE SOD PHOS & ACET 6 (3-3) MG/ML IJ SUSP
12.0000 mg | Freq: Once | INTRAMUSCULAR | Status: AC
  Administered 2019-05-16: 12 mg via INTRAMUSCULAR
  Filled 2019-05-16: qty 5

## 2019-05-16 NOTE — Progress Notes (Signed)
Patient sent to [facility]U today for Betamethasone injection. Found to be dilated 3 cm in office.  Medical screening exam performed.  She denies contractions, LOF, or vaginal bleeding. Good fetal movement.  Will return to [facility]U tomorrow for second injection.   Judeth Horn, NP

## 2019-05-16 NOTE — MAU Note (Signed)
Pt is already 3 cm.  Sent over for betamethasone injection.  To return tomorrow to MAU for 2nd dose.  Denies any contractions. Labor precautions reviewed with NP. No leaking or bleeding.

## 2019-05-16 NOTE — Discharge Instructions (Signed)

## 2019-05-17 ENCOUNTER — Inpatient Hospital Stay (HOSPITAL_COMMUNITY)
Admission: AD | Admit: 2019-05-17 | Discharge: 2019-05-17 | Disposition: A | Discharge status: Home / Self Care | Payer: 59 | Attending: Obstetrics and Gynecology | Admitting: Obstetrics and Gynecology

## 2019-05-17 ENCOUNTER — Other Ambulatory Visit: Payer: Self-pay

## 2019-05-17 DIAGNOSIS — Z3A35 35 weeks gestation of pregnancy: Secondary | ICD-10-CM | POA: Diagnosis not present

## 2019-05-17 MED ORDER — BETAMETHASONE SOD PHOS & ACET 6 (3-3) MG/ML IJ SUSP
12.0000 mg | Freq: Once | INTRAMUSCULAR | Status: AC
  Administered 2019-05-17: 12 mg via INTRAMUSCULAR

## 2019-05-17 NOTE — MAU Provider Note (Signed)
[id number] at [redacted]w[redacted]d presents to MAU today for her second dose of betamethasone. She reports irregular cramping/contractions, unchanged from yesterday. There is good fetal movement. BMZ injection given by RN. Pt tolerated well and was discharged from MAU with preterm labor precautions.

## 2019-05-17 NOTE — MAU Note (Signed)
.   Shelley Mckinney is a 29 y.o. at [redacted]w[redacted]d here in MAU reporting:she is here for her 2nd dose of BMZ. Denies any contractions, VB or LOF  at  Present.    Pain score: 0 Vitals:   05/17/19 1537  BP: 123/70  Pulse: (!) 104  Resp: 18  Temp: 98.2 F (36.8 C)     FHT:133 Lab orders placed from triage:

## 2019-05-29 ENCOUNTER — Encounter (HOSPITAL_COMMUNITY): Payer: Self-pay | Admitting: Obstetrics and Gynecology

## 2019-05-29 ENCOUNTER — Inpatient Hospital Stay (HOSPITAL_COMMUNITY)
Admission: AD | Admit: 2019-05-29 | Discharge: 2019-06-01 | DRG: 785 | Disposition: A | Discharge status: Home / Self Care | Payer: 59 | Attending: Obstetrics and Gynecology | Admitting: Obstetrics and Gynecology

## 2019-05-29 ENCOUNTER — Other Ambulatory Visit: Payer: Self-pay

## 2019-05-29 ENCOUNTER — Inpatient Hospital Stay (HOSPITAL_COMMUNITY): Payer: 59 | Admitting: Anesthesiology

## 2019-05-29 DIAGNOSIS — O9912 Other diseases of the blood and blood-forming organs and certain disorders involving the immune mechanism complicating childbirth: Secondary | ICD-10-CM | POA: Diagnosis present

## 2019-05-29 DIAGNOSIS — Z3A37 37 weeks gestation of pregnancy: Secondary | ICD-10-CM

## 2019-05-29 DIAGNOSIS — D649 Anemia, unspecified: Secondary | ICD-10-CM | POA: Diagnosis present

## 2019-05-29 DIAGNOSIS — Z6791 Unspecified blood type, Rh negative: Secondary | ICD-10-CM

## 2019-05-29 DIAGNOSIS — Z20822 Contact with and (suspected) exposure to covid-19: Secondary | ICD-10-CM | POA: Diagnosis present

## 2019-05-29 DIAGNOSIS — D6959 Other secondary thrombocytopenia: Secondary | ICD-10-CM | POA: Diagnosis present

## 2019-05-29 DIAGNOSIS — O9902 Anemia complicating childbirth: Secondary | ICD-10-CM | POA: Diagnosis present

## 2019-05-29 DIAGNOSIS — O26893 Other specified pregnancy related conditions, third trimester: Secondary | ICD-10-CM | POA: Diagnosis present

## 2019-05-29 DIAGNOSIS — Z302 Encounter for sterilization: Secondary | ICD-10-CM

## 2019-05-29 DIAGNOSIS — Z98891 History of uterine scar from previous surgery: Secondary | ICD-10-CM

## 2019-05-29 HISTORY — PX: TUBAL LIGATION: SHX77

## 2019-05-29 LAB — RPR: RPR Ser Ql: NONREACTIVE

## 2019-05-29 LAB — CBC
HCT: 31.4 % — ABNORMAL LOW (ref 36.0–46.0)
HCT: 29.5 % — ABNORMAL LOW (ref 36.0–46.0)
Hemoglobin: 9 g/dL — ABNORMAL LOW (ref 12.0–15.0)
Hemoglobin: 9 g/dL — ABNORMAL LOW (ref 12.0–15.0)
MCH: 21.8 pg — ABNORMAL LOW (ref 26.0–34.0)
MCH: 22.3 pg — ABNORMAL LOW (ref 26.0–34.0)
MCHC: 28.7 g/dL — ABNORMAL LOW (ref 30.0–36.0)
MCHC: 30.5 g/dL (ref 30.0–36.0)
MCV: 76 fL — ABNORMAL LOW (ref 80.0–100.0)
MCV: 73.2 fL — ABNORMAL LOW (ref 80.0–100.0)
Platelets: 128 10*3/uL — ABNORMAL LOW (ref 150–400)
Platelets: UNDETERMINED 10*3/uL (ref 150–400)
RBC: 4.13 MIL/uL (ref 3.87–5.11)
RBC: 4.03 MIL/uL (ref 3.87–5.11)
RDW: 16.9 % — ABNORMAL HIGH (ref 11.5–15.5)
RDW: 16.7 % — ABNORMAL HIGH (ref 11.5–15.5)
WBC: 10.4 10*3/uL (ref 4.0–10.5)
WBC: 19.8 10*3/uL — ABNORMAL HIGH (ref 4.0–10.5)
nRBC: 0.2 % (ref 0.0–0.2)
nRBC: 0 % (ref 0.0–0.2)

## 2019-05-29 LAB — TYPE AND SCREEN
ABO/RH(D): B NEG
Antibody Screen: NEGATIVE

## 2019-05-29 LAB — SARS CORONAVIRUS 2 (TAT 6-24 HRS): SARS Coronavirus 2: NEGATIVE

## 2019-05-29 MED ORDER — OXYCODONE-ACETAMINOPHEN 5-325 MG PO TABS: 1.0000 | ORAL_TABLET | ORAL | Status: DC | PRN

## 2019-05-29 MED ORDER — CEFAZOLIN SODIUM-DEXTROSE 2-3 GM-%(50ML) IV SOLR
INTRAVENOUS | Status: DC | PRN
  Administered 2019-05-29: 2 g via INTRAVENOUS

## 2019-05-29 MED ORDER — ACETAMINOPHEN 10 MG/ML IV SOLN
1000.0000 mg | Freq: Once | INTRAVENOUS | Status: DC | PRN
  Administered 2019-05-29: 1000 mg via INTRAVENOUS

## 2019-05-29 MED ORDER — DIPHENHYDRAMINE HCL 50 MG/ML IJ SOLN
INTRAMUSCULAR | Status: AC
  Filled 2019-05-29: qty 1

## 2019-05-29 MED ORDER — ONDANSETRON HCL 4 MG/2ML IJ SOLN
INTRAMUSCULAR | Status: AC
  Filled 2019-05-29: qty 2

## 2019-05-29 MED ORDER — MORPHINE SULFATE (PF) 0.5 MG/ML IJ SOLN
INTRAMUSCULAR | Status: DC | PRN
  Administered 2019-05-29: 3 mg via EPIDURAL

## 2019-05-29 MED ORDER — TERBUTALINE SULFATE 1 MG/ML IJ SOLN: 0.2500 mg | Freq: Once | INTRAMUSCULAR | Status: DC | PRN

## 2019-05-29 MED ORDER — SOD CITRATE-CITRIC ACID 500-334 MG/5ML PO SOLN
30.0000 mL | ORAL | Status: DC | PRN
  Administered 2019-05-29: 30 mL via ORAL
  Filled 2019-05-29: qty 30

## 2019-05-29 MED ORDER — ACETAMINOPHEN 325 MG PO TABS: 650.0000 mg | ORAL_TABLET | ORAL | Status: DC | PRN

## 2019-05-29 MED ORDER — OXYCODONE-ACETAMINOPHEN 5-325 MG PO TABS: 2.0000 | ORAL_TABLET | ORAL | Status: DC | PRN

## 2019-05-29 MED ORDER — LIDOCAINE HCL (PF) 1 % IJ SOLN
INTRAMUSCULAR | Status: DC | PRN
  Administered 2019-05-29: 2 mL via EPIDURAL
  Administered 2019-05-29: 4 mL via EPIDURAL

## 2019-05-29 MED ORDER — LACTATED RINGERS IV SOLN: 500.0000 mL | Freq: Once | INTRAVENOUS | Status: DC

## 2019-05-29 MED ORDER — LACTATED RINGERS IV SOLN: INTRAVENOUS | Status: DC

## 2019-05-29 MED ORDER — DEXAMETHASONE SODIUM PHOSPHATE 10 MG/ML IJ SOLN
INTRAMUSCULAR | Status: DC | PRN
  Administered 2019-05-29: 10 mg via INTRAVENOUS

## 2019-05-29 MED ORDER — IBUPROFEN 800 MG PO TABS
800.0000 mg | ORAL_TABLET | Freq: Three times a day (TID) | ORAL | Status: DC
  Administered 2019-05-30 – 2019-06-01 (×7): 800 mg via ORAL
  Filled 2019-05-29 (×7): qty 1

## 2019-05-29 MED ORDER — SCOPOLAMINE 1 MG/3DAYS TD PT72
MEDICATED_PATCH | TRANSDERMAL | Status: AC
  Filled 2019-05-29: qty 1

## 2019-05-29 MED ORDER — DIBUCAINE (PERIANAL) 1 % EX OINT: 1.0000 "application " | TOPICAL_OINTMENT | CUTANEOUS | Status: DC | PRN

## 2019-05-29 MED ORDER — KETOROLAC TROMETHAMINE 30 MG/ML IJ SOLN: 30.0000 mg | Freq: Four times a day (QID) | INTRAMUSCULAR | Status: AC | PRN

## 2019-05-29 MED ORDER — METHYLERGONOVINE MALEATE 0.2 MG/ML IJ SOLN
INTRAMUSCULAR | Status: DC | PRN
  Administered 2019-05-29: .2 mg via INTRAMUSCULAR

## 2019-05-29 MED ORDER — LACTATED RINGERS IV SOLN
500.0000 mL | INTRAVENOUS | Status: DC | PRN
  Administered 2019-05-29: 1000 mL via INTRAVENOUS

## 2019-05-29 MED ORDER — NALBUPHINE HCL 10 MG/ML IJ SOLN: 5.0000 mg | INTRAMUSCULAR | Status: DC | PRN

## 2019-05-29 MED ORDER — FENTANYL-BUPIVACAINE-NACL 0.5-0.125-0.9 MG/250ML-% EP SOLN
12.0000 mL/h | EPIDURAL | Status: DC | PRN
  Filled 2019-05-29: qty 250

## 2019-05-29 MED ORDER — OXYTOCIN 40 UNITS IN NORMAL SALINE INFUSION - SIMPLE MED
INTRAVENOUS | Status: DC | PRN
  Administered 2019-05-29: 500 mL via INTRAVENOUS

## 2019-05-29 MED ORDER — NALOXONE HCL 4 MG/10ML IJ SOLN
1.0000 ug/kg/h | INTRAVENOUS | Status: DC | PRN
  Filled 2019-05-29: qty 5

## 2019-05-29 MED ORDER — NALBUPHINE HCL 10 MG/ML IJ SOLN: 5.0000 mg | Freq: Once | INTRAMUSCULAR | Status: DC | PRN

## 2019-05-29 MED ORDER — ACETAMINOPHEN 10 MG/ML IV SOLN
INTRAVENOUS | Status: AC
  Filled 2019-05-29: qty 100

## 2019-05-29 MED ORDER — KETOROLAC TROMETHAMINE 30 MG/ML IJ SOLN
30.0000 mg | Freq: Once | INTRAMUSCULAR | Status: AC | PRN
  Administered 2019-05-29: 30 mg via INTRAVENOUS

## 2019-05-29 MED ORDER — PRENATAL MULTIVITAMIN CH
1.0000 | ORAL_TABLET | Freq: Every day | ORAL | Status: DC
  Administered 2019-05-30 – 2019-05-31 (×2): 1 via ORAL
  Filled 2019-05-29 (×2): qty 1

## 2019-05-29 MED ORDER — WITCH HAZEL-GLYCERIN EX PADS: 1.0000 "application " | MEDICATED_PAD | CUTANEOUS | Status: DC | PRN

## 2019-05-29 MED ORDER — LIDOCAINE HCL (PF) 1 % IJ SOLN: 30.0000 mL | INTRAMUSCULAR | Status: DC | PRN

## 2019-05-29 MED ORDER — COCONUT OIL OIL: 1.0000 "application " | TOPICAL_OIL | Status: DC | PRN

## 2019-05-29 MED ORDER — EPHEDRINE 5 MG/ML INJ
10.0000 mg | INTRAVENOUS | Status: DC | PRN
  Filled 2019-05-29: qty 2

## 2019-05-29 MED ORDER — LIDOCAINE-EPINEPHRINE (PF) 2 %-1:200000 IJ SOLN
INTRAMUSCULAR | Status: DC | PRN
  Administered 2019-05-29: 10 mL via EPIDURAL

## 2019-05-29 MED ORDER — PHENYLEPHRINE 40 MCG/ML (10ML) SYRINGE FOR IV PUSH (FOR BLOOD PRESSURE SUPPORT)
80.0000 ug | PREFILLED_SYRINGE | INTRAVENOUS | Status: DC | PRN
  Filled 2019-05-29: qty 10

## 2019-05-29 MED ORDER — SIMETHICONE 80 MG PO CHEW: 80.0000 mg | CHEWABLE_TABLET | ORAL | Status: DC | PRN

## 2019-05-29 MED ORDER — OXYCODONE HCL 5 MG PO TABS
5.0000 mg | ORAL_TABLET | ORAL | Status: DC | PRN
  Administered 2019-06-01: 5 mg via ORAL
  Filled 2019-05-29: qty 1

## 2019-05-29 MED ORDER — ACETAMINOPHEN 325 MG PO TABS
650.0000 mg | ORAL_TABLET | ORAL | Status: DC | PRN
  Administered 2019-05-30 – 2019-05-31 (×3): 650 mg via ORAL
  Filled 2019-05-29 (×4): qty 2

## 2019-05-29 MED ORDER — DIPHENHYDRAMINE HCL 50 MG/ML IJ SOLN: 12.5000 mg | INTRAMUSCULAR | Status: DC | PRN

## 2019-05-29 MED ORDER — SIMETHICONE 80 MG PO CHEW
80.0000 mg | CHEWABLE_TABLET | ORAL | Status: DC
  Administered 2019-05-30 – 2019-05-31 (×3): 80 mg via ORAL
  Filled 2019-05-29 (×3): qty 1

## 2019-05-29 MED ORDER — OXYTOCIN 40 UNITS IN NORMAL SALINE INFUSION - SIMPLE MED
2.5000 [IU]/h | INTRAVENOUS | Status: DC
  Filled 2019-05-29: qty 1000

## 2019-05-29 MED ORDER — ONDANSETRON HCL 4 MG/2ML IJ SOLN
4.0000 mg | Freq: Four times a day (QID) | INTRAMUSCULAR | Status: DC | PRN
  Administered 2019-05-29: 4 mg via INTRAVENOUS
  Filled 2019-05-29: qty 2

## 2019-05-29 MED ORDER — DIPHENHYDRAMINE HCL 25 MG PO CAPS: 25.0000 mg | ORAL_CAPSULE | ORAL | Status: DC | PRN

## 2019-05-29 MED ORDER — ZOLPIDEM TARTRATE 5 MG PO TABS: 5.0000 mg | ORAL_TABLET | Freq: Every evening | ORAL | Status: DC | PRN

## 2019-05-29 MED ORDER — FLEET ENEMA 7-19 GM/118ML RE ENEM: 1.0000 | ENEMA | RECTAL | Status: DC | PRN

## 2019-05-29 MED ORDER — ONDANSETRON HCL 4 MG/2ML IJ SOLN: 4.0000 mg | Freq: Three times a day (TID) | INTRAMUSCULAR | Status: DC | PRN

## 2019-05-29 MED ORDER — KETOROLAC TROMETHAMINE 30 MG/ML IJ SOLN
INTRAMUSCULAR | Status: AC
  Filled 2019-05-29: qty 1

## 2019-05-29 MED ORDER — CEFAZOLIN SODIUM-DEXTROSE 2-4 GM/100ML-% IV SOLN
INTRAVENOUS | Status: AC
  Filled 2019-05-29: qty 100

## 2019-05-29 MED ORDER — OXYTOCIN 40 UNITS IN NORMAL SALINE INFUSION - SIMPLE MED
INTRAVENOUS | Status: AC
  Filled 2019-05-29: qty 1000

## 2019-05-29 MED ORDER — TRANEXAMIC ACID-NACL 1000-0.7 MG/100ML-% IV SOLN
INTRAVENOUS | Status: AC
  Filled 2019-05-29: qty 100

## 2019-05-29 MED ORDER — MENTHOL 3 MG MT LOZG: 1.0000 | LOZENGE | OROMUCOSAL | Status: DC | PRN

## 2019-05-29 MED ORDER — SODIUM CHLORIDE (PF) 0.9 % IJ SOLN
INTRAMUSCULAR | Status: DC | PRN
  Administered 2019-05-29: 9 mL/h via EPIDURAL

## 2019-05-29 MED ORDER — METHYLERGONOVINE MALEATE 0.2 MG/ML IJ SOLN
INTRAMUSCULAR | Status: AC
  Filled 2019-05-29: qty 1

## 2019-05-29 MED ORDER — BUTORPHANOL TARTRATE 1 MG/ML IJ SOLN
1.0000 mg | INTRAMUSCULAR | Status: DC | PRN
  Administered 2019-05-29: 1 mg via INTRAVENOUS
  Filled 2019-05-29: qty 1

## 2019-05-29 MED ORDER — SENNOSIDES-DOCUSATE SODIUM 8.6-50 MG PO TABS
2.0000 | ORAL_TABLET | ORAL | Status: DC
  Administered 2019-05-30 – 2019-05-31 (×3): 2 via ORAL
  Filled 2019-05-29 (×3): qty 2

## 2019-05-29 MED ORDER — SODIUM CHLORIDE 0.9% FLUSH: 3.0000 mL | INTRAVENOUS | Status: DC | PRN

## 2019-05-29 MED ORDER — SCOPOLAMINE 1 MG/3DAYS TD PT72
1.0000 | MEDICATED_PATCH | Freq: Once | TRANSDERMAL | Status: DC
  Administered 2019-05-29: 1.5 mg via TRANSDERMAL

## 2019-05-29 MED ORDER — DEXAMETHASONE SODIUM PHOSPHATE 10 MG/ML IJ SOLN
INTRAMUSCULAR | Status: AC
  Filled 2019-05-29: qty 1

## 2019-05-29 MED ORDER — OXYTOCIN 40 UNITS IN NORMAL SALINE INFUSION - SIMPLE MED: 2.5000 [IU]/h | INTRAVENOUS | Status: AC

## 2019-05-29 MED ORDER — DIPHENHYDRAMINE HCL 25 MG PO CAPS: 25.0000 mg | ORAL_CAPSULE | Freq: Four times a day (QID) | ORAL | Status: DC | PRN

## 2019-05-29 MED ORDER — LIDOCAINE-EPINEPHRINE (PF) 2 %-1:200000 IJ SOLN
INTRAMUSCULAR | Status: AC
  Filled 2019-05-29: qty 10

## 2019-05-29 MED ORDER — NALOXONE HCL 0.4 MG/ML IJ SOLN: 0.4000 mg | INTRAMUSCULAR | Status: DC | PRN

## 2019-05-29 MED ORDER — DIPHENHYDRAMINE HCL 50 MG/ML IJ SOLN
INTRAMUSCULAR | Status: DC | PRN
  Administered 2019-05-29: 12.5 mg via INTRAVENOUS

## 2019-05-29 MED ORDER — FENTANYL CITRATE (PF) 100 MCG/2ML IJ SOLN
25.0000 ug | INTRAMUSCULAR | Status: DC | PRN
  Administered 2019-05-29: 50 ug via INTRAVENOUS

## 2019-05-29 MED ORDER — FENTANYL CITRATE (PF) 100 MCG/2ML IJ SOLN
INTRAMUSCULAR | Status: AC
  Filled 2019-05-29: qty 2

## 2019-05-29 MED ORDER — OXYTOCIN 40 UNITS IN NORMAL SALINE INFUSION - SIMPLE MED
1.0000 m[IU]/min | INTRAVENOUS | Status: DC
  Administered 2019-05-29: 8 m[IU]/min via INTRAVENOUS
  Administered 2019-05-29: 6 m[IU]/min via INTRAVENOUS
  Administered 2019-05-29: 2 m[IU]/min via INTRAVENOUS
  Administered 2019-05-29: 4 m[IU]/min via INTRAVENOUS
  Administered 2019-05-29: 12 m[IU]/min via INTRAVENOUS

## 2019-05-29 MED ORDER — SIMETHICONE 80 MG PO CHEW
80.0000 mg | CHEWABLE_TABLET | Freq: Three times a day (TID) | ORAL | Status: DC
  Administered 2019-05-30 – 2019-06-01 (×7): 80 mg via ORAL
  Filled 2019-05-29 (×7): qty 1

## 2019-05-29 MED ORDER — TRANEXAMIC ACID-NACL 1000-0.7 MG/100ML-% IV SOLN
INTRAVENOUS | Status: DC | PRN
  Administered 2019-05-29: 1000 mg via INTRAVENOUS

## 2019-05-29 MED ORDER — OXYTOCIN BOLUS FROM INFUSION: 500.0000 mL | Freq: Once | INTRAVENOUS | Status: DC

## 2019-05-29 MED ORDER — TETANUS-DIPHTH-ACELL PERTUSSIS 5-2.5-18.5 LF-MCG/0.5 IM SUSP: 0.5000 mL | Freq: Once | INTRAMUSCULAR | Status: DC

## 2019-05-29 MED ORDER — ONDANSETRON HCL 4 MG/2ML IJ SOLN
INTRAMUSCULAR | Status: DC | PRN
  Administered 2019-05-29: 4 mg via INTRAVENOUS

## 2019-05-29 MED ORDER — PHENYLEPHRINE 40 MCG/ML (10ML) SYRINGE FOR IV PUSH (FOR BLOOD PRESSURE SUPPORT)
80.0000 ug | PREFILLED_SYRINGE | INTRAVENOUS | Status: DC | PRN
  Filled 2019-05-29 (×2): qty 10

## 2019-05-29 MED ORDER — ACETAMINOPHEN 500 MG PO TABS
1000.0000 mg | ORAL_TABLET | Freq: Four times a day (QID) | ORAL | Status: AC
  Administered 2019-05-30 (×2): 1000 mg via ORAL
  Filled 2019-05-29 (×2): qty 2

## 2019-05-29 MED ORDER — MORPHINE SULFATE (PF) 0.5 MG/ML IJ SOLN
INTRAMUSCULAR | Status: AC
  Filled 2019-05-29: qty 10

## 2019-05-29 NOTE — H&P (Signed)
[name] is a 29 y.o. female [id number] at 34 4/7 weeks (EDD 06/15/19 by 7 week [place] and unsure LMP) presenting for regular painful contractions and cervical change from 3cm in office to 5+here at hospital.  FHR in MAU did not meet criteria for category 1 consistently, but did improve with IV fluids after admission.   Prenatal care significant for gestational thrombocytopenia with platelets stable at 120-130K range.  She has a h/o gestational hypertension but never started the recommended baby ASA--BP stable this pregnancy.  She had preterm dilation to 3 cm at 36 weeks so received betamethasone x 2 at that time.  She is varicella non-immune She was interested in a tubal ligation but did not sign papers until 05/04/19 so will need an interval.    OB History    Gravida  8   Para  3   Term  3   Preterm      AB  4   Living  3     SAB  1   TAB  3   Ectopic      Multiple      Live Births  3         02-02-2007, 38 wks  M, 6lbs 3oz, Vaginal Delivery 08-24-2012, 38 wks  M, 6lbs 11oz, Vaginal Delivery 10-25-2013, 39.1 wks  F, 6lbs 5oz, Vaginal Delivery TAB x 3 SAB X1  Past Medical History:  Diagnosis Date  . Anemia 2014   during pregnancy  . Chlamydia   . Infection    PARTNER + CHLAMYDIA  . Infection    FREQUENT YEAST  . Infection    BV  . NSVD (normal spontaneous vaginal delivery) 08/24/2012   2311  . Pregnancy induced hypertension 2014   post partum HTN  . ROM (rupture of membranes), premature 10/25/2013  . Strep throat    Past Surgical History:  Procedure Laterality Date  . THERAPEUTIC ABORTION     x3  . WISDOM TOOTH EXTRACTION     Family History: family history includes Cancer in her paternal grandfather and paternal uncle; Diabetes in her paternal grandmother and sister; Down syndrome in her cousin and maternal uncle; Drug abuse in her father and mother. Social History:  reports that she has never smoked. She has never used smokeless tobacco. She reports that  she does not drink alcohol or use drugs.     Maternal Diabetes: No Genetic Screening: Normal Maternal Ultrasounds/Referrals: Normal Fetal Ultrasounds or other Referrals:  None Maternal Substance Abuse:  No Significant Maternal Medications:  None Significant Maternal Lab Results:  Rh negative Other Comments:  None  Review of Systems  Constitutional: Negative for fever.  Gastrointestinal: Positive for abdominal pain.   Maternal Medical History:  Reason for admission: Contractions.   Contractions: Onset was 3-5 hours ago.   Frequency: regular.   Perceived severity is moderate.    Fetal activity: Perceived fetal activity is normal.    Prenatal Complications - Diabetes: none.    Dilation: (P) 5.5 Effacement (%): 100 Station: -2 Exam by:: Jacqulyn Liner, RN Blood pressure (!) 110/54, pulse 100, temperature 97.9 F (36.6 C), temperature source Oral, resp. rate 16, height 4\' 9"  (1.448 m), weight 71.2 kg, unknown if currently breastfeeding. Maternal Exam:  Uterine Assessment: Contraction strength is moderate.  Contraction frequency is regular.   Abdomen: Patient reports no abdominal tenderness. Fetal presentation: vertex  Introitus: Normal vulva. Normal vagina.  Pelvis: adequate for delivery.      Physical Exam  Constitutional: She appears  well-developed.  Cardiovascular: Normal rate and regular rhythm.  Respiratory: Effort normal.  GI: Soft.  Genitourinary:    Vulva and vagina normal.   Neurological: She is alert.  Psychiatric: She has a normal mood and affect.    Prenatal labs: ABO, Rh: --/--/B NEG (03/29 0914) Antibody: NEG (03/29 0914) Rubella:  Immune RPR: NON REACTIVE (03/29 0914)  HBsAg:   Neg HIV:   NR GBS:   Neg First trimester screen negative One hour GCT 101  Assessment/Plan: Pt admitted and progressed in labor to 5-6cm, received epidural and comfortable.  FHR acceptable.    Will add pitocin prn to augment and follow progress.   Logan Bores 05/29/2019, 1:04 PM

## 2019-05-29 NOTE — Progress Notes (Signed)
Patient ID: Shelley Mckinney, female   DOB: December 30, 1990, 30 y.o.   MRN: [id number] Pt has been adequate for several hours now with MVU 180-200  FHR initially improved with amnioinfusion and position changes but now having persistent late decelerations despite interventions  Cervix 80/6+/-1 with increasing edema of anterior lip, now down to almost the introitus, with fetal station still up at -1  D/w pt she has not progressed in labor and that with the baby having a worsening tracing I feel we need to proceed with c-section.  We discussed the procedure in detail including risks of bleeding, infection and possible damage to bowel or bladder.  She would like permanent sterilization with a tubal ligation and we will proceed at time of c-section. We discussed partial salpingectomies and risk of failure of 1/100.  She knows this is a permanent procedure and is not reversible.  OR notified and will proceed as soon as ready.  Pitocin turned off.

## 2019-05-29 NOTE — MAU Note (Signed)
Patient states CTX woke her up in the middle of night, now 5-7 min apart.  Pt rating pain 6/10.  Reports feeling baby move, denies LOF.  Having some pink spotting.

## 2019-05-29 NOTE — Transfer of Care (Signed)
Immediate Anesthesia Transfer of Care Note  Patient: Shelley Mckinney  Procedure(s) Performed: CESAREAN SECTION (N/A )  Patient Location: PACU  Anesthesia Type:Epidural  Level of Consciousness: awake, alert  and oriented  Airway & Oxygen Therapy: Patient Spontanous Breathing  Post-op Assessment: Report given to RN and Post -op Vital signs reviewed and stable  Post vital signs: Reviewed and stable  Last Vitals:  Vitals Value Taken Time  BP 139/77 05/29/19 2028  Temp    Pulse 100 05/29/19 2031  Resp 15 05/29/19 2031  SpO2 100 % 05/29/19 2031  Vitals shown include unvalidated device data.  Last Pain:  Vitals:   05/29/19 1828  TempSrc: Oral  PainSc: Asleep         Complications: No apparent anesthesia complications

## 2019-05-29 NOTE — Anesthesia Preprocedure Evaluation (Signed)
Anesthesia Evaluation  Patient identified by MRN, date of birth, ID band Patient awake    Reviewed: Allergy & Precautions, Patient's Chart, lab work & pertinent test results  History of Anesthesia Complications Negative for: history of anesthetic complications  Airway Mallampati: II  TM Distance: >3 FB Neck ROM: Full    Dental no notable dental hx.    Pulmonary neg pulmonary ROS,    Pulmonary exam normal        Cardiovascular negative cardio ROS Normal cardiovascular exam     Neuro/Psych negative neurological ROS  negative psych ROS   GI/Hepatic negative GI ROS, Neg liver ROS,   Endo/Other  negative endocrine ROS  Renal/GU negative Renal ROS  negative genitourinary   Musculoskeletal negative musculoskeletal ROS (+)   Abdominal   Peds  Hematology  (+) anemia , Hgb 9.0, plts 128k   Anesthesia Other Findings Day of surgery medications reviewed with patient.  Reproductive/Obstetrics (+) Pregnancy                             Anesthesia Physical Anesthesia Plan  ASA: III  Anesthesia Plan: Epidural   Post-op Pain Management:    Induction:   PONV Risk Score and Plan: Treatment may vary due to age or medical condition  Airway Management Planned: Natural Airway  Additional Equipment:   Intra-op Plan:   Post-operative Plan:   Informed Consent: I have reviewed the patients History and Physical, chart, labs and discussed the procedure including the risks, benefits and alternatives for the proposed anesthesia with the patient or authorized representative who has indicated his/her understanding and acceptance.       Plan Discussed with:   Anesthesia Plan Comments:         Anesthesia Quick Evaluation

## 2019-05-29 NOTE — Anesthesia Procedure Notes (Signed)
Epidural Patient location during procedure: OB Start time: 05/29/2019 10:51 AM End time: 05/29/2019 10:54 AM  Staffing Anesthesiologist: Kaylyn Layer, MD Performed: anesthesiologist   Preanesthetic Checklist Completed: patient identified, IV checked, risks and benefits discussed, monitors and equipment checked, pre-op evaluation and timeout performed  Epidural Patient position: sitting Prep: DuraPrep and site prepped and draped Patient monitoring: continuous pulse ox, blood pressure and heart rate Approach: midline Location: L3-L4 Injection technique: LOR air  Needle:  Needle type: Tuohy  Needle gauge: 17 G Needle length: 9 cm Needle insertion depth: 6 cm Catheter type: closed end flexible Catheter size: 19 Gauge Catheter at skin depth: 11 cm Test dose: negative and Other (1% lidocaine)  Assessment Events: blood not aspirated, injection not painful, no injection resistance, no paresthesia and negative IV test  Additional Notes Patient identified. Risks, benefits, and alternatives discussed with patient including but not limited to bleeding, infection, nerve damage, paralysis, failed block, incomplete pain control, headache, blood pressure changes, nausea, vomiting, reactions to medication, itching, and postpartum back pain. Confirmed with bedside nurse the patient's most recent platelet count. Confirmed with patient that they are not currently taking any anticoagulation, have any bleeding history, or any family history of bleeding disorders. Patient expressed understanding and wished to proceed. All questions were answered. Sterile technique was used throughout the entire procedure. Please see nursing notes for vital signs.   Crisp LOR on first pass. Test dose was given through epidural catheter and negative prior to continuing to dose epidural or start infusion. Warning signs of high block given to the patient including shortness of breath, tingling/numbness in hands, complete  motor block, or any concerning symptoms with instructions to call for help. Patient was given instructions on fall risk and not to get out of bed. All questions and concerns addressed with instructions to call with any issues or inadequate analgesia.  Reason for block:procedure for pain

## 2019-05-29 NOTE — Progress Notes (Addendum)
Patient ID: Shelley Mckinney, female   DOB: [birth year], 29 y.o.   MRN: [id number] Pt feeling some more pressure  afeb VSS FHR with some decreased variability and occasional possible late decelerations--category 2  Cervix anterior lip swollen 80/6+/-1 OP  Pt with OP presentation and FHR category 2 FSE placed to more closely assess FHR  position changed to left Sims  Pitocin at 12 mu

## 2019-05-29 NOTE — Op Note (Signed)
Operative Note    Preoperative Diagnosis Term pregnancy at 37 4/7 weeks Arrest of dilation Category 3 strip Desires permanent sterilization  Postoperative Diagnosis Same   Procedure Primary low transverse c-section with 2 layer closure of uterus and bilateral tubal sterilization with distal salpingectomies  Surgeon Paula Compton, MD  Anesthesia Epidural  Fluids: EBL 716mL UOP 126mL clearing urine IVF 1577mL LR  Findings A viable female infant in the direct OP presentation.  Apgars 5,9 Nuchal cord x 1.   Nml uterus tubes and ovaries  Specimen Placenta to L&D  Tubal segments to pathology  Procedure Note Patient was taken to the operating room where epidural anesthesia was boosted and found to be adequate by Allis clamp test. She was prepped and draped in the normal sterile fashion in the dorsal supine position with a leftward tilt. An appropriate time out was performed. A Pfannenstiel skin incision was then made with the scalpel and carried through to the underlying layer of fascia by sharp dissection and Bovie cautery. The fascia was nicked in the midline and the incision was extended laterally with Mayo scissors. The inferior aspect of the incision was grasped Coker clamps and dissected off the underlying rectus muscles. In a similar fashion the superior aspect was dissected off the rectus muscles. Rectus muscles were separated in the midline and the peritoneal cavity entered bluntly. The peritoneal incision was then extended both superiorly and inferiorly with careful attention to avoid both bowel and bladder. The Alexis self-retaining wound retractor was then placed within the incision and the lower uterine segment exposed. The bladder flap was developed with Metzenbaum scissors and pushed away from the lower uterine segment. The lower uterine segment was then incised in a transverse fashion and the cavity itself entered bluntly. The incision was extended bluntly. The infant's  head was then lifted and delivered from the incision without difficulty. The remainder of the infant delivered and the nose and mouth bulb suctioned with the cord clamped and cut as well after delayed cord clamping. The infant was handed off to the waiting pediatricians. The placenta was then spontaneously expressed from the uterus and the uterus cleared of all clots and debris with moist lap sponge.  The uterus was atonic and required pitocin, methergine, and a dose of TXA to control generalized oozing.   The uterine incision was then repaired in 2 layers the first layer was a running locked layer of 1-0 chromic and the second an imbricating layer of the same suture. A few interrupted figure of eight sutures were required to gain hemostasis at the incision. The tubes and ovaries were inspected and the gutters cleared of all clots and debris.   The adnexa was elevated  to identify the fallopian tubes bilaterally and trace them to their fimbriated ends.  The left tube was elevated out of the incision and a Kelly clamp placed across the distal end. The right tube was likewise elevated and the distal end clamped with a Kelly clamp.  Each distal end was amputed bilterally and handed off to pathology.  Each tubal pedicle was secured with suture ligature of 2-0 vicryl x 2.  A second look at each tube confirmed hemostasis.  The uterine incision was inspected and found to be hemostatic. All instruments and sponges as well as the Alexis retractor were then removed from the abdomen. The rectus muscles and peritoneum were then reapproximated with a running suture of 2-0 Vicryl. The fascia was then closed with 0 Vicryl in a running fashion.  Subcutaneous tissue was reapproximated with 3-0 plain in a running fashion. The skin was closed with a subcuticular stitch of 4-0 Vicryl on a Keith needle and then reinforced with benzoin and Steri-Strips. At the conclusion of the procedure all instruments and sponge counts were correct.  Patient was taken to the recovery room in good condition with her baby accompanying her skin to skin.

## 2019-05-30 ENCOUNTER — Encounter (HOSPITAL_COMMUNITY)
Admission: AD | Disposition: A | Discharge status: Home / Self Care | Payer: Self-pay | Source: Home / Self Care | Attending: Obstetrics and Gynecology

## 2019-05-30 LAB — CBC
HCT: 30 % — ABNORMAL LOW (ref 36.0–46.0)
HCT: 27.9 % — ABNORMAL LOW (ref 36.0–46.0)
Hemoglobin: 9.3 g/dL — ABNORMAL LOW (ref 12.0–15.0)
Hemoglobin: 8.3 g/dL — ABNORMAL LOW (ref 12.0–15.0)
MCH: 22.5 pg — ABNORMAL LOW (ref 26.0–34.0)
MCH: 22 pg — ABNORMAL LOW (ref 26.0–34.0)
MCHC: 31 g/dL (ref 30.0–36.0)
MCHC: 29.7 g/dL — ABNORMAL LOW (ref 30.0–36.0)
MCV: 72.6 fL — ABNORMAL LOW (ref 80.0–100.0)
MCV: 73.8 fL — ABNORMAL LOW (ref 80.0–100.0)
Platelets: UNDETERMINED 10*3/uL (ref 150–400)
Platelets: UNDETERMINED 10*3/uL (ref 150–400)
RBC: 4.13 MIL/uL (ref 3.87–5.11)
RBC: 3.78 MIL/uL — ABNORMAL LOW (ref 3.87–5.11)
RDW: 16.7 % — ABNORMAL HIGH (ref 11.5–15.5)
RDW: 16.9 % — ABNORMAL HIGH (ref 11.5–15.5)
WBC: 21.5 10*3/uL — ABNORMAL HIGH (ref 4.0–10.5)
WBC: 23.6 10*3/uL — ABNORMAL HIGH (ref 4.0–10.5)
nRBC: 0 % (ref 0.0–0.2)
nRBC: 0 % (ref 0.0–0.2)

## 2019-05-30 SURGERY — Surgical Case
Anesthesia: Epidural

## 2019-05-30 MED ORDER — RHO D IMMUNE GLOBULIN 1500 UNIT/2ML IJ SOSY
300.0000 ug | PREFILLED_SYRINGE | Freq: Once | INTRAMUSCULAR | Status: AC
  Administered 2019-05-30: 300 ug via INTRAVENOUS
  Filled 2019-05-30: qty 2

## 2019-05-30 SURGICAL SUPPLY — 39 items
BENZOIN TINCTURE PRP APPL 2/3 (GAUZE/BANDAGES/DRESSINGS) ×3 IMPLANT
CHLORAPREP W/TINT 26ML (MISCELLANEOUS) ×3 IMPLANT
CLAMP CORD UMBIL (MISCELLANEOUS) IMPLANT
CLOSURE STERI STRIP 1/2 X4 (GAUZE/BANDAGES/DRESSINGS) ×3 IMPLANT
CLOSURE WOUND 1/2 X4 (GAUZE/BANDAGES/DRESSINGS)
CLOTH BEACON ORANGE TIMEOUT ST (SAFETY) ×3 IMPLANT
DRSG OPSITE POSTOP 4X10 (GAUZE/BANDAGES/DRESSINGS) ×3 IMPLANT
ELECT REM PT RETURN 9FT ADLT (ELECTROSURGICAL) ×3
ELECTRODE REM PT RTRN 9FT ADLT (ELECTROSURGICAL) ×1 IMPLANT
EXTRACTOR VACUUM KIWI (MISCELLANEOUS) IMPLANT
GLOVE BIO SURGEON STRL SZ 6.5 (GLOVE) ×2 IMPLANT
GLOVE BIO SURGEONS STRL SZ 6.5 (GLOVE) ×1
GLOVE BIOGEL PI IND STRL 7.0 (GLOVE) ×1 IMPLANT
GLOVE BIOGEL PI INDICATOR 7.0 (GLOVE) ×2
GOWN STRL REUS W/TWL LRG LVL3 (GOWN DISPOSABLE) ×6 IMPLANT
KIT ABG SYR 3ML LUER SLIP (SYRINGE) IMPLANT
NEEDLE HYPO 25X5/8 SAFETYGLIDE (NEEDLE) IMPLANT
NS IRRIG 1000ML POUR BTL (IV SOLUTION) ×3 IMPLANT
PACK C SECTION WH (CUSTOM PROCEDURE TRAY) ×3 IMPLANT
PAD OB MATERNITY 4.3X12.25 (PERSONAL CARE ITEMS) ×3 IMPLANT
PENCIL SMOKE EVAC W/HOLSTER (ELECTROSURGICAL) ×3 IMPLANT
RTRCTR C-SECT PINK 25CM LRG (MISCELLANEOUS) ×3 IMPLANT
STRIP CLOSURE SKIN 1/2X4 (GAUZE/BANDAGES/DRESSINGS) IMPLANT
SUT CHROMIC 1 CTX 36 (SUTURE) ×6 IMPLANT
SUT PLAIN 0 NONE (SUTURE) IMPLANT
SUT PLAIN 2 0 XLH (SUTURE) ×3 IMPLANT
SUT VIC AB 0 CT1 27 (SUTURE) ×6
SUT VIC AB 0 CT1 27XBRD ANBCTR (SUTURE) ×2 IMPLANT
SUT VIC AB 2-0 CT1 (SUTURE) ×3 IMPLANT
SUT VIC AB 2-0 CT1 27 (SUTURE) ×3
SUT VIC AB 2-0 CT1 TAPERPNT 27 (SUTURE) ×1 IMPLANT
SUT VIC AB 3-0 CT1 27 (SUTURE)
SUT VIC AB 3-0 CT1 TAPERPNT 27 (SUTURE) IMPLANT
SUT VIC AB 3-0 SH 27 (SUTURE) ×3
SUT VIC AB 3-0 SH 27X BRD (SUTURE) ×1 IMPLANT
SUT VIC AB 4-0 KS 27 (SUTURE) ×3 IMPLANT
TOWEL OR 17X24 6PK STRL BLUE (TOWEL DISPOSABLE) ×3 IMPLANT
TRAY FOLEY W/BAG SLVR 14FR LF (SET/KITS/TRAYS/PACK) ×3 IMPLANT
WATER STERILE IRR 1000ML POUR (IV SOLUTION) ×3 IMPLANT

## 2019-05-30 NOTE — Progress Notes (Signed)
Subjective: Postpartum Day 1: Cesarean Delivery Patient reports tolerating PO, + flatus and no problems voiding.  Lochia mild. Pain well controlled with medication. She denies dizziness/lightheadedness, HA, CP or SOB. She is bonding well with baby - breastfeeding. She has no complaints.   Objective: Vital signs in last 24 hours: Temp:  [97.9 F (36.6 C)-98.9 F (37.2 C)] 98.7 F (37.1 C) (03/30 0753) Pulse Rate:  [66-115] 78 (03/30 0753) Resp:  [16-18] 18 (03/30 0753) BP: (94-180)/(41-150) 107/67 (03/30 0753) SpO2:  [100 %] 100 % (03/30 0753)  Physical Exam:  General: alert, cooperative and no distress Lochia: appropriate Uterine Fundus: firm Incision: no significant drainage DVT Evaluation: No evidence of DVT seen on physical exam; SCDs in place  Recent Labs    05/29/19 2242 05/30/19 0521  HGB 9.3* 8.3*  HCT 30.0* 27.9*    Assessment/Plan: Status post Cesarean section. Doing well postoperatively.  Anemia/gestational thrombocytopenia: Continue current care: monitor bleeding given hx gestational thrombocytopenia. If bleeding concerning order citrated cbc given noted clumping on past draws.Shelley Mckinney 05/30/2019, 9:39 AM

## 2019-05-30 NOTE — Progress Notes (Signed)
No foley documented in flowsheets. This RN removed foley at 0800 per protocol. Royston Cowper, RN

## 2019-05-30 NOTE — Anesthesia Postprocedure Evaluation (Signed)
Anesthesia Post Note  Patient: Shelley Mckinney  Procedure(s) Performed: CESAREAN SECTION (N/A )     Patient location during evaluation: PACU Anesthesia Type: Epidural Level of consciousness: awake and alert Pain management: pain level controlled Vital Signs Assessment: post-procedure vital signs reviewed and stable Respiratory status: spontaneous breathing, nonlabored ventilation and respiratory function stable Cardiovascular status: stable Postop Assessment: no headache, no backache and epidural receding Anesthetic complications: no    Last Vitals:  Vitals:   05/30/19 0123 05/30/19 0125  BP: 130/77 114/66  Pulse: 66 97  Resp: 18 18  Temp:  37 C  SpO2:  100%    Last Pain:  Vitals:   05/30/19 0136  TempSrc:   PainSc: 3    Pain Goal:                   Kannan Proia L Glorene Leitzke

## 2019-05-30 NOTE — Lactation Note (Signed)
This note was copied from a baby's chart. Lactation Consultation Note  Patient Name: Shelley Mckinney Date: 05/30/2019 Reason for consult: Initial assessment;Early term 37-38.6wks P4, 5 hour ETI female infant. Infant has one void since delivery.  Mom's hx: C/S delivery and GHTN. Per mom, she is currently on the [facility] in [place] and plans to obtain breast pump with her insurance. 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 Per mom, she breastfed her 1st child for 5 months, 2nd child for 2 weeks and she did not breastfed her 3rd child who is now 46 years old. Mom's feeding choice at admission is breast and formula feeding. Per mom, infant has not breastfed yet made an attempt earlier.  This will be mom's first attempt to latch infant at breast. When LC entered room, mom was doing STS with infant and talking on her cell phone. LC reviewed hand expression and infant was given 2 mls of colostrum by spoon, infant appeared more alert afterwards. Mom latched infant on her right breast using the football hold position, LC extended infant's lower jaw for deeper latch, nose and chin was touching breast and swallows could be heard while infant was feeding. Infant breastfed for 10 minutes and given an additional 4 mls of colostrum by spoon. Mom knows to call RN or LC if she has any questions, concerns or needs assistance with latching infant at breast.  Mom was doing STS with infant when [facility] left room. Mom knows to breastfeed according hunger cues, 8 to 12 times within 24 hours and not exceed 3 hours without breastfeeding infant. Mom will continue to do STS as much as possible. LC discussed breastfeeding resources after hospital dischange: LC hotline, [facility] outpatient clinic and LC online breastfeeding support group.   Maternal Data Formula Feeding for Exclusion: Yes Reason for exclusion: Mother's choice to formula and breast feed on admission Has patient been taught Hand  Expression?: Yes Does the patient have breastfeeding experience prior to this delivery?: Yes  Feeding Feeding Type: Breast Fed  LATCH Score Latch: Grasps breast easily, tongue down, lips flanged, rhythmical sucking.  Audible Swallowing: Spontaneous and intermittent  Type of Nipple: Everted at rest and after stimulation  Comfort (Breast/Nipple): Soft / non-tender  Hold (Positioning): Assistance needed to correctly position infant at breast and maintain latch.  LATCH Score: 9  Interventions Interventions: Breast feeding basics reviewed;Assisted with latch;Skin to skin;Hand express;Breast massage;Breast compression;Adjust position;Support pillows;Position options;Expressed milk  Lactation Tools Discussed/Used WIC Program: Yes   Consult Status Consult Status: Follow-up Date: 05/30/19 Follow-up type: In-patient    Shelley Mckinney 05/30/2019, 12:45 AM

## 2019-05-30 NOTE — Plan of Care (Signed)
  Problem: Education: Goal: Knowledge of General Education information will improve Description: Including pain rating scale, medication(s)/side effects and non-pharmacologic comfort measures Outcome: Completed/Met   Problem: Clinical Measurements: Goal: Ability to maintain clinical measurements within normal limits will improve Outcome: Completed/Met Goal: Will remain free from infection Outcome: Completed/Met Goal: Diagnostic test results will improve Outcome: Completed/Met Goal: Respiratory complications will improve Outcome: Completed/Met Goal: Cardiovascular complication will be avoided Outcome: Completed/Met   

## 2019-05-31 LAB — SURGICAL PATHOLOGY

## 2019-05-31 LAB — RH IG WORKUP (INCLUDES ABO/RH)
ABO/RH(D): B NEG
Fetal Screen: NEGATIVE
Gestational Age(Wks): 37.4
Unit division: 0

## 2019-05-31 NOTE — Progress Notes (Signed)
Subjective: Postpartum Day 2: Cesarean Delivery Patient reports incisional pain, tolerating PO and no problems voiding. Nl lochia, pain controlled   Objective: Vital signs in last 24 hours: Temp:  [97.7 F (36.5 C)-98.6 F (37 C)] 98 F (36.7 C) (03/31 2481) Pulse Rate:  [73-96] 73 (03/31 0608) Resp:  [16-20] 16 (03/31 8590) BP: (101-117)/(59-70) 101/64 (03/31 0608) SpO2:  [100 %] 100 % (03/31 9311)  Physical Exam:  General: alert and no distress Lochia: appropriate Uterine Fundus: firm Incision: healing well DVT Evaluation: No evidence of DVT seen on physical exam.  Recent Labs    05/29/19 2242 05/30/19 0521  HGB 9.3* 8.3*  HCT 30.0* 27.9*    Assessment/Plan: Status post Cesarean section. Doing well postoperatively.  Continue current care.  Shelley Mckinney 05/31/2019, 8:06 AM

## 2019-06-01 MED ORDER — IBUPROFEN 800 MG PO TABS: 800.0000 mg | ORAL_TABLET | Freq: Three times a day (TID) | ORAL | 0 refills | Status: AC

## 2019-06-01 MED ORDER — OXYCODONE HCL 5 MG PO TABS: 5.0000 mg | ORAL_TABLET | ORAL | 0 refills | Status: AC | PRN

## 2019-06-01 NOTE — Discharge Instructions (Signed)
As per discharge pamphlet °

## 2019-06-01 NOTE — Discharge Summary (Signed)
OB Discharge Summary     Patient Name: EVANTHIA MAUND DOB: 1990-08-15 MRN: [id number]  Date of admission: 05/29/2019 Delivering MD: Paula Compton   Date of discharge: 06/01/2019  Admitting diagnosis: Normal labor [O80, Z37.9] Status post primary low transverse cesarean section [Z98.891] Intrauterine pregnancy: [redacted]w[redacted]d     Secondary diagnosis:  Active Problems:   Normal labor   Status post primary low transverse cesarean section      Discharge diagnosis: Term Pregnancy Delivered and arrest of dilation, FHR decels, desires permanent sterility                                  Hospital course:  Onset of Labor With Unplanned C/S  29 y.o. yo [id number] at [redacted]w[redacted]d was admitted in Latent Labor on 05/29/2019. Patient had a labor course significant for protracted labor, FHR decels. Membrane Rupture Time/Date: 1:01 PM ,05/29/2019   The patient went for cesarean section due to Arrest of Dilation and FHR decels, and delivered a Viable infant,05/29/2019  Details of operation can be found in separate operative note. Patient had an uncomplicated postpartum course.  She is ambulating,tolerating a regular diet, passing flatus, and urinating well.  Patient is discharged home in stable condition 06/01/19.  Physical exam  Vitals:   05/31/19 0608 05/31/19 1418 05/31/19 2127 06/01/19 0517  BP: 101/64 107/61 119/75 114/75  Pulse: 73 81 91 80  Resp: 16 18 17 18   Temp: 98 F (36.7 C) 98.4 F (36.9 C) 98.9 F (37.2 C) 99 F (37.2 C)  TempSrc: Oral Oral Oral Oral  SpO2: 100% 100% 100% 99%  Weight:      Height:       General: alert Lochia: appropriate Uterine Fundus: firm Incision: Healing well with no significant drainage  Labs: Lab Results  Component Value Date   WBC 23.6 (H) 05/30/2019   HGB 8.3 (L) 05/30/2019   HCT 27.9 (L) 05/30/2019   MCV 73.8 (L) 05/30/2019   PLT PLATELET CLUMPS NOTED ON SMEAR, UNABLE TO ESTIMATE 05/30/2019   CMP Latest Ref Rng & Units 04/24/2019  Glucose 70 - 99 mg/dL 77   BUN 6 - 20 mg/dL 6  Creatinine 0.44 - 1.00 mg/dL 0.58  Sodium 135 - 145 mmol/L 139  Potassium 3.5 - 5.1 mmol/L 4.4  Chloride 98 - 111 mmol/L 109  CO2 22 - 32 mmol/L 21(L)  Calcium 8.9 - 10.3 mg/dL 9.0  Total Protein 6.5 - 8.1 g/dL 7.0  Total Bilirubin 0.3 - 1.2 mg/dL 0.3  Alkaline Phos 38 - 126 U/L 120  AST 15 - 41 U/L 13(L)  ALT 0 - 44 U/L 12    Discharge instruction: per After Visit Summary and "Baby and Me Booklet".  After visit meds:  Allergies as of 06/01/2019   No Known Allergies     Medication List    TAKE these medications   Fusion Plus Caps Take 1 capsule by mouth daily.   ibuprofen 800 MG tablet Commonly known as: ADVIL Take 1 tablet (800 mg total) by mouth every 8 (eight) hours.   oxyCODONE 5 MG immediate release tablet Commonly known as: Oxy IR/ROXICODONE Take 1 tablet (5 mg total) by mouth every 4 (four) hours as needed for severe pain.   prenatal multivitamin Tabs tablet Take 1 tablet by mouth daily at 12 noon.       Diet: routine diet  Activity: Advance as tolerated. Pelvic rest for 6 weeks.  Outpatient follow up:2 weeks Follow up Appt: Future Appointments  Date Time Provider Department Center  06/06/2019 11:15 AM [facility] LAB 4 [facility] None  06/06/2019 11:45 AM Pollyann Samples, NP [facility] None    Newborn Data: Live born female  Birth Weight: 7 lb 14.1 oz (3575 g) APGAR: 5, 8  Newborn Delivery   Birth date/time: 05/29/2019 19:28:00 Delivery type: C-Section, Low Transverse Trial of labor: Yes C-section categorization: Primary      Baby Feeding: Breast Disposition:home with mother   06/01/2019 Zenaida Niece, MD

## 2019-06-01 NOTE — Lactation Note (Signed)
This note was copied from a baby's chart. Lactation Consultation Note Baby 46 hrs old. Mom is BF/Formula feeding. Has mainly formula fed. Mom states she is going to do both. Mom is experienced BF. Mom has WIC and has f/u appt. Reviewed engorgement, supplementation, importance of I&O, breast massage, supply and demand. Discussed importance of BF before giving formula, weaning off of formula. Mom states she has no questions or concerns at this time.  Patient Name: Girl Mikaelyn Arthurs [id number] Date: 06/01/2019 Reason for consult: Follow-up assessment;Early term 37-38.6wks   Maternal Data    Feeding Feeding Type: Breast Fed  LATCH Score Latch: Repeated attempts needed to sustain latch, nipple held in mouth throughout feeding, stimulation needed to elicit sucking reflex.  Audible Swallowing: A few with stimulation  Type of Nipple: Everted at rest and after stimulation  Comfort (Breast/Nipple): Soft / non-tender  Hold (Positioning): Assistance needed to correctly position infant at breast and maintain latch.  LATCH Score: 7  Interventions Interventions: Breast feeding basics reviewed  Lactation Tools Discussed/Used     Consult Status Consult Status: Complete Date: 06/01/19    Charyl Dancer 06/01/2019, 2:50 AM

## 2019-06-01 NOTE — Progress Notes (Signed)
POD #3 LTCS Doing well, ready to go home Afeb, VSS Abd- soft, fundus firm, incision intact Discharge home today

## 2019-06-06 ENCOUNTER — Inpatient Hospital Stay: Payer: 59

## 2019-06-06 ENCOUNTER — Telehealth: Payer: Self-pay | Admitting: Nurse Practitioner

## 2019-06-06 ENCOUNTER — Telehealth: Payer: Self-pay | Admitting: *Deleted

## 2019-06-06 ENCOUNTER — Inpatient Hospital Stay: Payer: 59 | Admitting: Nurse Practitioner

## 2019-06-06 NOTE — Telephone Encounter (Signed)
Called pt about rescheduling appt's for today, due to recently giving birth. Pt asked to reschedule 2-3 weeks from now due to cesarean. Santiago Glad NP, ask if pt is currently taking iron, pt stated, "no not since cesarean." Advised to continue to take iron if tolerated. Scheduling message sent for rescheduling

## 2019-06-06 NOTE — Telephone Encounter (Signed)
Called pt per 4/6 sch message - unable to reach pt . Left message for patient to call back to reschedule appt

## 2019-06-29 NOTE — Progress Notes (Signed)
[facility]   [phone] [phone]   Clinic Follow up Note   Patient Care Team: Patient, No Pcp Per as PCP - General (General Practice) 07/03/2019  CHIEF COMPLAINT: F/u thrombocytopenia   CURRENT THERAPY: Observation  INTERVAL HISTORY: Shelley Mckinney returns for f/u as scheduled. She was last seen 04/24/19 at [redacted] weeks gestation. She delivered a term pregnancy at [redacted]w[redacted]d via C-section on 05/29/19. She had an uncomplicated postpartum course in the hospital and was discharged on 06/01/19.   She presents today by herself. She is tired from having a newborn. She and the baby are doing well. Has f/u with Ob on 5/10. She started menstrual period on 5/1 but is not heavy, her first since being pregnant. Denies other vaginal or rectal bleeding. She has been taking oral iron 3 times weekly when she remembers. Denies black stools. She has some mild constipation when she takes it, no n/v. Eats regular diet. No recent fever, chills, cough, chest pain, dyspnea or other concerns.    MEDICAL HISTORY:  Past Medical History:  Diagnosis Date  . Anemia 2014   during pregnancy  . Chlamydia   . Infection    PARTNER + CHLAMYDIA  . Infection    FREQUENT YEAST  . Infection    BV  . NSVD (normal spontaneous vaginal delivery) 08/24/2012   2311  . Pregnancy induced hypertension 2014   post partum HTN  . ROM (rupture of membranes), premature 10/25/2013  . Strep throat     SURGICAL HISTORY: Past Surgical History:  Procedure Laterality Date  . CESAREAN SECTION N/A 05/30/2019   Procedure: CESAREAN SECTION;  Surgeon: Huel Cote, MD;  Location: [facility] LD ORS;  Service: Obstetrics;  Laterality: N/A;  . CESAREAN SECTION W/BTL Bilateral 05/29/2019   4th child; LTCS; bilateral tubal sterilization with distal salpingectomies  . THERAPEUTIC ABORTION     x3  . TUBAL LIGATION Bilateral 05/29/2019   bilateral tubal sterilization with distal salpingectomies  . WISDOM TOOTH EXTRACTION       I have reviewed the social history and family history with the patient and they are unchanged from previous note.  ALLERGIES:  has No Known Allergies.  MEDICATIONS:  Current Outpatient Medications  Medication Sig Dispense Refill  . ibuprofen (ADVIL) 800 MG tablet Take 1 tablet (800 mg total) by mouth every 8 (eight) hours. 30 tablet 0  . Iron-FA-B Cmp-C-Biot-Probiotic (FUSION PLUS) CAPS Take 1 capsule by mouth daily.    Marland Kitchen oxyCODONE (OXY IR/ROXICODONE) 5 MG immediate release tablet Take 1 tablet (5 mg total) by mouth every 4 (four) hours as needed for severe pain. 10 tablet 0  . Prenatal Vit-Fe Fumarate-FA (PRENATAL MULTIVITAMIN) TABS tablet Take 1 tablet by mouth daily at 12 noon.     No current facility-administered medications for this visit.    PHYSICAL EXAMINATION:  Vitals:   07/03/19 1140  BP: 111/74  Pulse: 83  Resp: 18  Temp: 98 F (36.7 C)  SpO2: 100%   Filed Weights   07/03/19 1140  Weight: 136 lb (61.7 kg)    GENERAL:alert, no distress and comfortable SKIN: no rash  EYES:  sclera clear NECK: without mass LUNGS: clear with normal breathing effort HEART: regular rate & rhythm, no lower extremity edema ABDOMEN: abdomen soft, non-tender and normal bowel sounds. Normal post partum appearance. Low transverse incision with 3 areas of shallow opening. No erythema or drainage  NEURO: alert & oriented x 3 with fluent speech  LABORATORY DATA:  I have reviewed  the data as listed CBC Latest Ref Rng & Units 07/03/2019 05/30/2019 05/29/2019  WBC 4.0 - 10.5 K/uL 5.3 23.6(H) 21.5(H)  Hemoglobin 12.0 - 15.0 g/dL 9.4(L) 8.3(L) 9.3(L)  Hematocrit 36.0 - 46.0 % 32.8(L) 27.9(L) 30.0(L)  Platelets 150 - 400 K/uL 176 PLATELET CLUMPS NOTED ON SMEAR, UNABLE TO ESTIMATE PLATELET CLUMPS NOTED ON SMEAR, UNABLE TO ESTIMATE     RADIOGRAPHIC STUDIES: I have personally reviewed the radiological images as listed and agreed with the findings in the report. No results found.   ASSESSMENT  & PLAN: 41 yof  1. Anemia  -she has history of IDA, she is taking oral iron (Fusion) once daily and prenatal vitamin when she initially presented at [redacted] weeks gestation ([id number]).  -iron studies on 04/24/19 showed ferritin 33, serum iron 29, TIBC 620, 5% transferrin saturation, consistent with mild iron deficiency. No signs of bleeding.  -I recommended to increase oral iron to 2-3 times daily, but she took inconsistently approx only 3 times per week. She is off prenatal vitamin now.  -no significant bleeding after C-section, she recovered from surgery. -She began first menstrual period since delivery on 07/01/19, not heavy bleeding -Today's labs show Hgb 9.4 which is stable for her, reticulocyte hemoglobin 23.1 which is c/w iron deficiency. She has not been checked for hemoglobinopathy at this point.  -Iron studies show ferritin 40, serum iron 17, 4% transferrin saturation, TIBC 390.  -We discussed oral vs IV iron and the risk/benefit and potential side effects including rare but serious reaction such as anaphylaxis. She agrees to do this if needed but prefers to optimize oral iron and will take 1-2 times daily if she tolerates. She was encouraged to take iron with orange juice/vitamin C to promote absorption  -She will return for lab in 3 months, if no significant improvement in iron studies/anemia may recommend IV iron.  -F/u in 6 months    2. Thrombocytopenia in pregnancy  -PLT on 03/17/19 was 49K with clumped platelets, repeated on 03/21/19 it was 126K,  -When she presented at [redacted] weeks gestation initially, no signs of bleeding, no evidence of pre-eclampsia or HELLP syndrome  -her repeat CBC was normal on 04/24/19 when platelet checked in citrate and remains normal today -resolved    PLAN: -CBC reviewed, plt normal -iron studies reviewed, optimize oral iron 1-2 times daily  -lab in 3 months, may consider IV iron at that time if no significant improvement  -f/u in 6 months  -Continue f/u with  ObGyn  All questions were answered. The patient knows to call the clinic with any problems, questions or concerns. No barriers to learning was detected.     Shelley Feeling, NP 07/03/19

## 2019-07-03 ENCOUNTER — Inpatient Hospital Stay: Payer: 59 | Attending: Nurse Practitioner

## 2019-07-03 ENCOUNTER — Inpatient Hospital Stay (HOSPITAL_BASED_OUTPATIENT_CLINIC_OR_DEPARTMENT_OTHER): Payer: 59 | Admitting: Nurse Practitioner

## 2019-07-03 ENCOUNTER — Encounter: Payer: Self-pay | Admitting: Nurse Practitioner

## 2019-07-03 ENCOUNTER — Other Ambulatory Visit: Payer: Self-pay

## 2019-07-03 VITALS — BP 111/74 | HR 83 | Temp 98.0°F | Resp 18 | Ht <= 58 in | Wt 136.0 lb

## 2019-07-03 DIAGNOSIS — O9081 Anemia of the puerperium: Secondary | ICD-10-CM | POA: Insufficient documentation

## 2019-07-03 DIAGNOSIS — D509 Iron deficiency anemia, unspecified: Secondary | ICD-10-CM | POA: Diagnosis not present

## 2019-07-03 DIAGNOSIS — D696 Thrombocytopenia, unspecified: Secondary | ICD-10-CM | POA: Diagnosis not present

## 2019-07-03 DIAGNOSIS — D6959 Other secondary thrombocytopenia: Secondary | ICD-10-CM | POA: Insufficient documentation

## 2019-07-03 DIAGNOSIS — D649 Anemia, unspecified: Secondary | ICD-10-CM | POA: Diagnosis not present

## 2019-07-03 LAB — IRON AND TIBC
Iron: 17 ug/dL — ABNORMAL LOW (ref 41–142)
Saturation Ratios: 4 % — ABNORMAL LOW (ref 21–57)
TIBC: 390 ug/dL (ref 236–444)
UIBC: 373 ug/dL (ref 120–384)

## 2019-07-03 LAB — CBC WITH DIFFERENTIAL (CANCER CENTER ONLY)
Abs Immature Granulocytes: 0.02 10*3/uL (ref 0.00–0.07)
Basophils Absolute: 0 10*3/uL (ref 0.0–0.1)
Basophils Relative: 0 %
Eosinophils Absolute: 0.3 10*3/uL (ref 0.0–0.5)
Eosinophils Relative: 6 %
HCT: 32.8 % — ABNORMAL LOW (ref 36.0–46.0)
Hemoglobin: 9.4 g/dL — ABNORMAL LOW (ref 12.0–15.0)
Immature Granulocytes: 0 %
Lymphocytes Relative: 25 %
Lymphs Abs: 1.3 10*3/uL (ref 0.7–4.0)
MCH: 21 pg — ABNORMAL LOW (ref 26.0–34.0)
MCHC: 28.7 g/dL — ABNORMAL LOW (ref 30.0–36.0)
MCV: 73.2 fL — ABNORMAL LOW (ref 80.0–100.0)
Monocytes Absolute: 0.4 10*3/uL (ref 0.1–1.0)
Monocytes Relative: 7 %
Neutro Abs: 3.3 10*3/uL (ref 1.7–7.7)
Neutrophils Relative %: 62 %
Platelet Count: 176 10*3/uL (ref 150–400)
RBC: 4.48 MIL/uL (ref 3.87–5.11)
RDW: 18 % — ABNORMAL HIGH (ref 11.5–15.5)
WBC Count: 5.3 10*3/uL (ref 4.0–10.5)
nRBC: 0 % (ref 0.0–0.2)

## 2019-07-03 LAB — RETIC PANEL
Immature Retic Fract: 18.9 % — ABNORMAL HIGH (ref 2.3–15.9)
RBC.: 4.48 MIL/uL (ref 3.87–5.11)
Retic Count, Absolute: 57.8 10*3/uL (ref 19.0–186.0)
Retic Ct Pct: 1.3 % (ref 0.4–3.1)
Reticulocyte Hemoglobin: 23.1 pg — ABNORMAL LOW (ref >27.9)

## 2019-07-03 LAB — PLATELET BY CITRATE

## 2019-07-03 LAB — FERRITIN: Ferritin: 40 ng/mL (ref 11–307)

## 2019-07-04 ENCOUNTER — Telehealth: Payer: Self-pay | Admitting: Nurse Practitioner

## 2019-07-04 NOTE — Telephone Encounter (Signed)
Scheduled appt per 5/3 los. Spoke with pt and she is aware of her appt dates and time.

## 2019-10-03 ENCOUNTER — Inpatient Hospital Stay: Payer: 59 | Attending: Nurse Practitioner

## 2019-10-03 ENCOUNTER — Other Ambulatory Visit: Payer: Self-pay

## 2019-10-03 DIAGNOSIS — D509 Iron deficiency anemia, unspecified: Secondary | ICD-10-CM | POA: Insufficient documentation

## 2019-10-03 DIAGNOSIS — D696 Thrombocytopenia, unspecified: Secondary | ICD-10-CM

## 2019-10-03 LAB — RETIC PANEL
Immature Retic Fract: 16.8 % — ABNORMAL HIGH (ref 2.3–15.9)
RBC.: 4.6 MIL/uL (ref 3.87–5.11)
Retic Count, Absolute: 45.1 10*3/uL (ref 19.0–186.0)
Retic Ct Pct: 1 % (ref 0.4–3.1)
Reticulocyte Hemoglobin: 29.8 pg (ref >27.9)

## 2019-10-03 LAB — IRON AND TIBC
Iron: 20 ug/dL — ABNORMAL LOW (ref 41–142)
Saturation Ratios: 4 % — ABNORMAL LOW (ref 21–57)
TIBC: 445 ug/dL — ABNORMAL HIGH (ref 236–444)
UIBC: 425 ug/dL — ABNORMAL HIGH (ref 120–384)

## 2019-10-03 LAB — CBC WITH DIFFERENTIAL (CANCER CENTER ONLY)
Abs Immature Granulocytes: 0.01 10*3/uL (ref 0.00–0.07)
Basophils Absolute: 0 10*3/uL (ref 0.0–0.1)
Basophils Relative: 0 %
Eosinophils Absolute: 0.2 10*3/uL (ref 0.0–0.5)
Eosinophils Relative: 4 %
HCT: 34.6 % — ABNORMAL LOW (ref 36.0–46.0)
Hemoglobin: 10.3 g/dL — ABNORMAL LOW (ref 12.0–15.0)
Immature Granulocytes: 0 %
Lymphocytes Relative: 34 %
Lymphs Abs: 1.7 10*3/uL (ref 0.7–4.0)
MCH: 22.5 pg — ABNORMAL LOW (ref 26.0–34.0)
MCHC: 29.8 g/dL — ABNORMAL LOW (ref 30.0–36.0)
MCV: 75.5 fL — ABNORMAL LOW (ref 80.0–100.0)
Monocytes Absolute: 0.6 10*3/uL (ref 0.1–1.0)
Monocytes Relative: 11 %
Neutro Abs: 2.6 10*3/uL (ref 1.7–7.7)
Neutrophils Relative %: 51 %
Platelet Count: 217 10*3/uL (ref 150–400)
RBC: 4.58 MIL/uL (ref 3.87–5.11)
RDW: 16.6 % — ABNORMAL HIGH (ref 11.5–15.5)
WBC Count: 5.1 10*3/uL (ref 4.0–10.5)
nRBC: 0 % (ref 0.0–0.2)

## 2019-10-03 LAB — PLATELET BY CITRATE

## 2019-10-03 LAB — FERRITIN: Ferritin: 37 ng/mL (ref 11–307)

## 2019-10-05 ENCOUNTER — Telehealth: Payer: Self-pay

## 2019-10-05 NOTE — Telephone Encounter (Signed)
Spoke with pt reviewed recent lab work inquired abut iron intake pt states she does take oral iron 325 mg QD BID was making her feel constipated so she reduced to once daily

## 2019-10-05 NOTE — Telephone Encounter (Signed)
-----   Message from Pollyann Samples, NP sent at 10/04/2019  3:54 PM EDT ----- Please call to see if she taking oral iron, dose/frequency and if she is tolerating it. CBC is improved but iron levels remain low.  Thanks, Clayborn Heron

## 2019-10-18 ENCOUNTER — Telehealth: Payer: Self-pay

## 2019-10-18 NOTE — Telephone Encounter (Signed)
Called spoke with pt about recommendations to increase orla iron to BID miralax stool softener and taking oral iron with vit C such as orange juice to increase absorption pt states she understands. PT encouraged to call for any questions concerns or changes pt states she understands no concerns voiced this call

## 2019-10-18 NOTE — Telephone Encounter (Signed)
-----   Message from Pollyann Samples, NP sent at 10/17/2019  3:44 PM EDT ----- Please call her back, I recommend to increase to BID and use miralax/stool softener. She can change to another oral iron agent she can tolerate better. Take it with vitamin C or orange juice. Will repeat labs and see her back 01/03/20 as scheduled.  Thanks, Clayborn Heron  ----- Message ----- From: Susanne Greenhouse, LPN Sent: 08/02/1306   8:11 AM EDT To: Pollyann Samples, NP  Pt is taking iron 325mg  1 tad QD states BID was making her constipated  ----- Message ----- From: , NP Sent: 10/04/2019   3:54 PM EDT To: 12/04/2019, LPN  Please call to see if she taking oral iron, dose/frequency and if she is tolerating it. CBC is improved but iron levels remain low.  Thanks, Susanne Greenhouse

## 2020-01-03 ENCOUNTER — Inpatient Hospital Stay: Payer: 59

## 2020-01-03 ENCOUNTER — Inpatient Hospital Stay: Payer: 59 | Admitting: Nurse Practitioner

## 2020-01-03 ENCOUNTER — Telehealth: Payer: Self-pay | Admitting: Nurse Practitioner

## 2020-01-03 NOTE — Telephone Encounter (Signed)
Called pt per 11/3 sch msg - no answer. Left message for patient to call back to reschedule.  ° °

## 2020-09-04 IMAGING — US OBSTETRIC <14 WK ULTRASOUND
1 series · 15 of 28 positions shown · non-contrast
Comparison: No prior scans from this gestation.

CLINICAL DATA: 27-year-old pregnant female presents with vaginal
bleeding. Quantitative beta HCG [DATE]. Uncertain LMP.

EXAM:
OBSTETRIC <14 WK ULTRASOUND
TECHNIQUE: Transabdominal ultrasound was performed for evaluation of the
gestation as well as the maternal uterus and adnexal regions.

[Series 1: obstetric <14 wk ultrasound · 46 acquisitions, 15 frames shown]
[im 1/46]
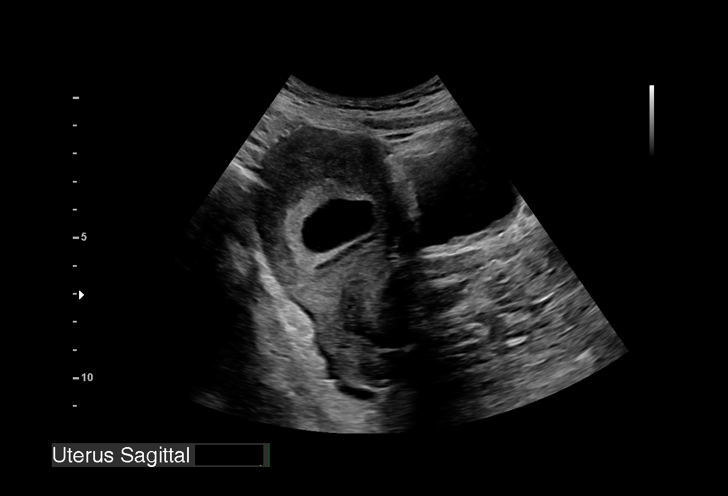
[im 4/46]
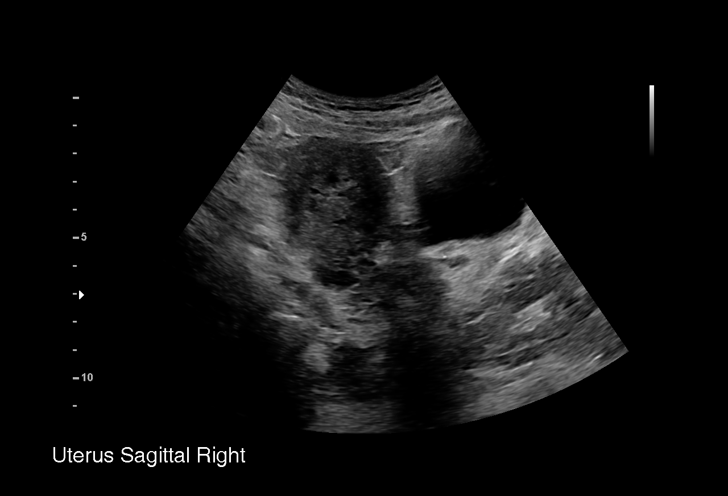
[im 7/46]
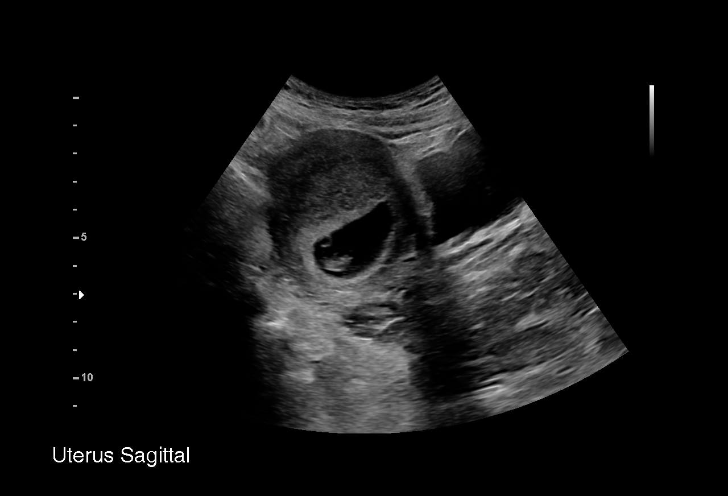
[im 11/46]
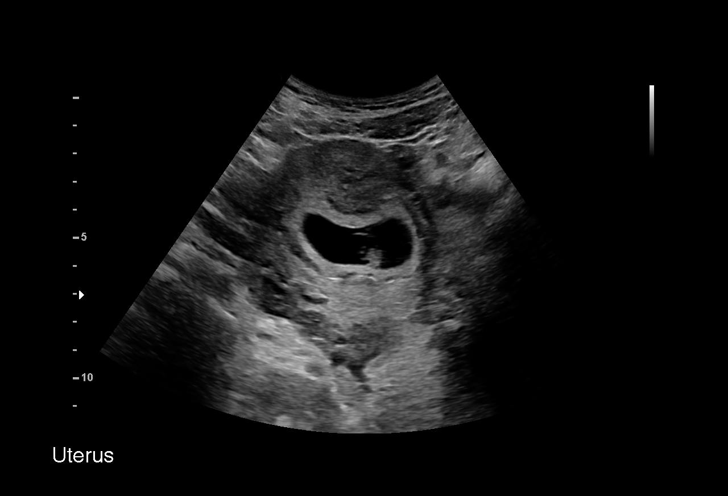
[im 14/46]
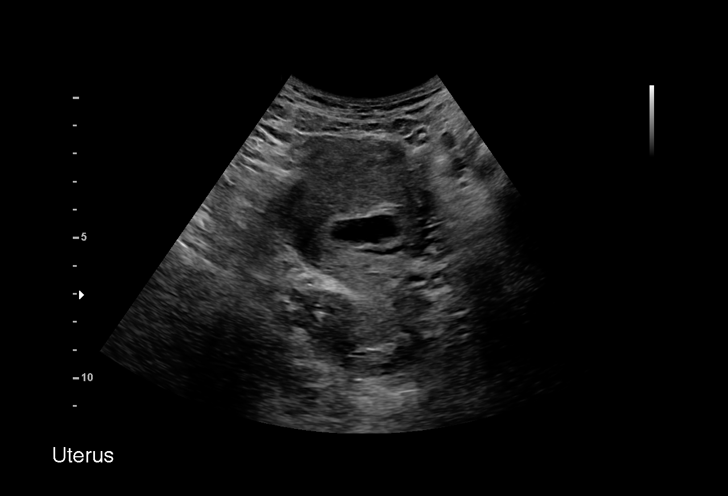
[im 17/46]
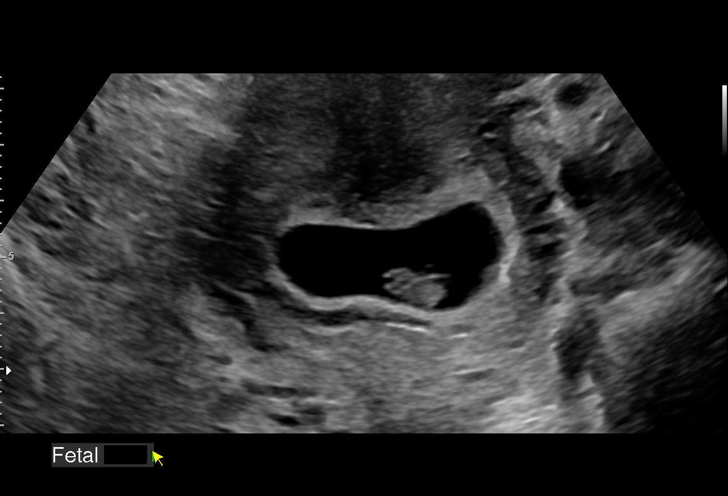
[im 21/46]
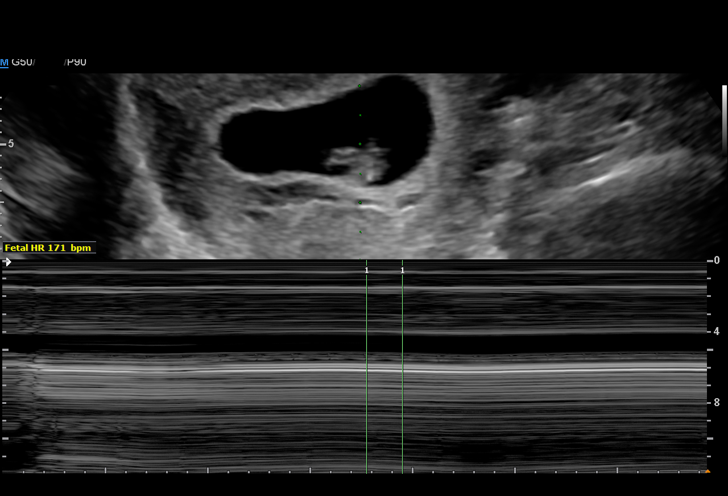
[im 24/46]
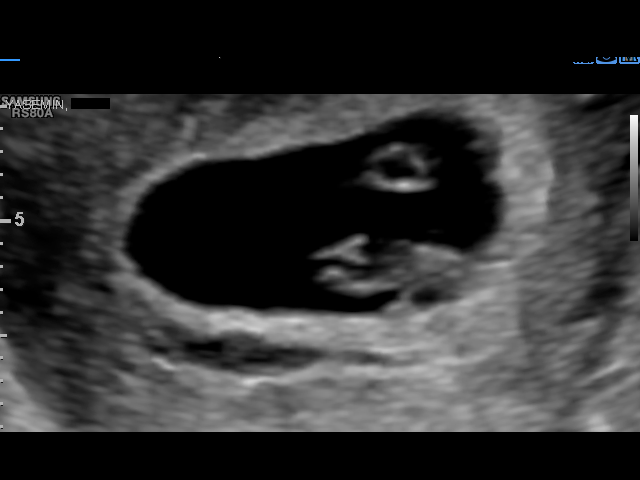
[im 26/46]
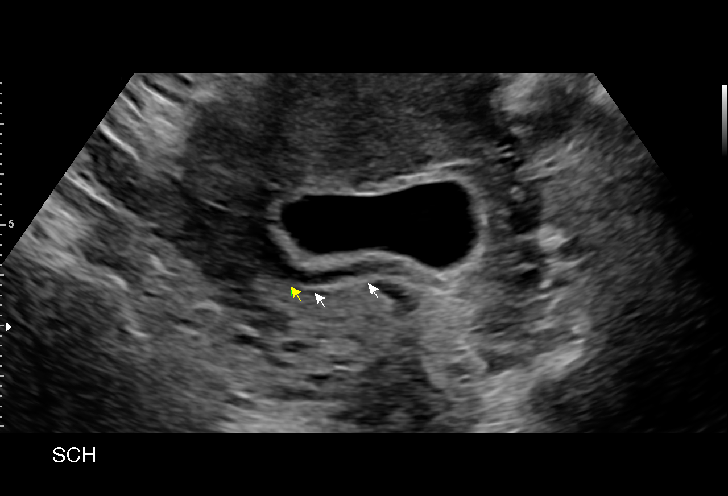
[im 29/46]
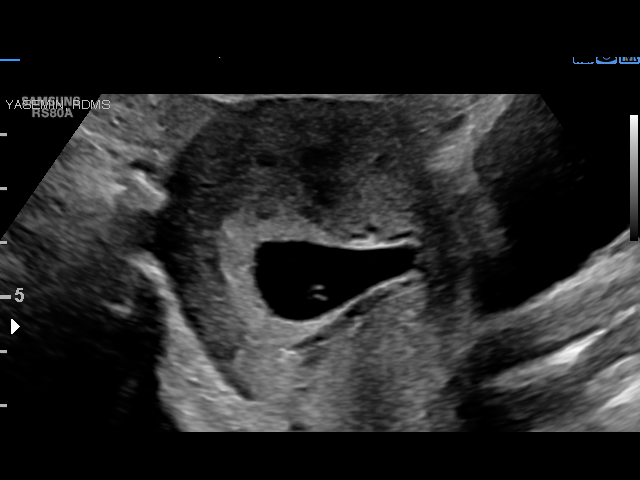
[im 32/46]
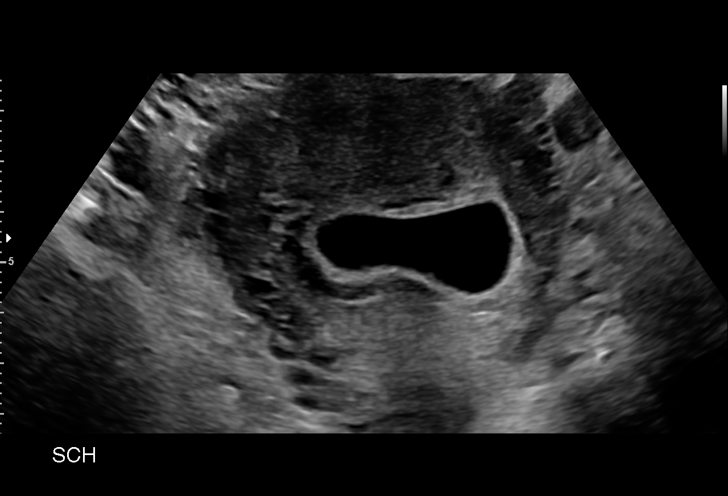
[im 36/46]
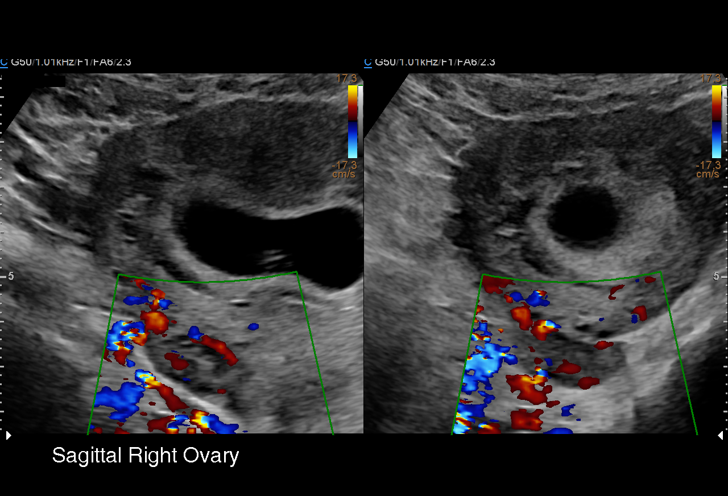
[im 39/46]
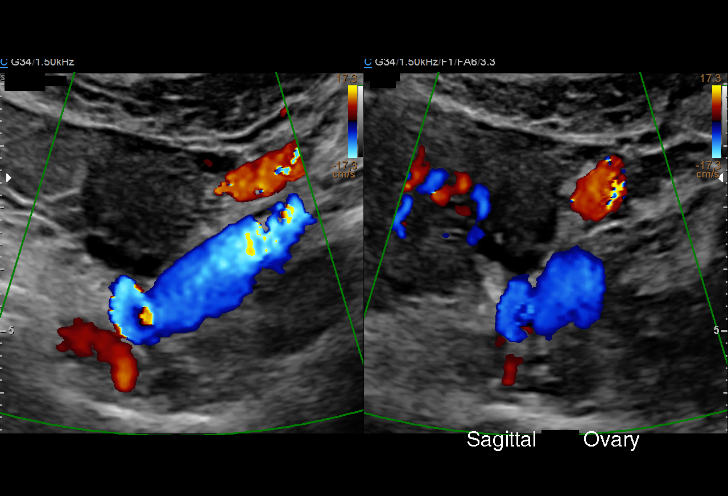
[im 42/46]
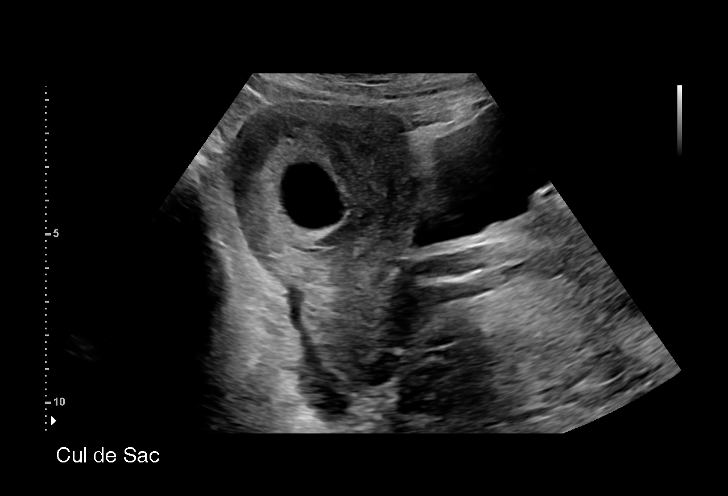
[im 46/46]
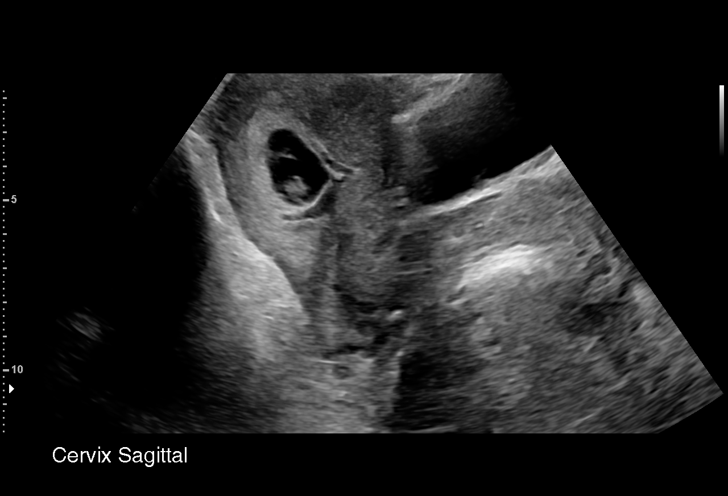

[15 of 28 positions shown; findings below may reference images not displayed]

FINDINGS: Intrauterine gestational sac: Single intrauterine gestational sac
appears normal in size, shape and position.

Yolk sac:  Visualized.

Embryo:  Visualized.

Embryonic Cardiac Activity: Visualized.

Embryonic Heart Rate: 171 bpm

CRL:   12.1 mm   7 w 3 d                  US EDC: 06/15/2019

Subchorionic hemorrhage: Small to moderate posterior perigestational
bleed involving approximately 40% of the gestational sac
circumference.

Maternal uterus/adnexae: No uterine fibroids or other myometrial
abnormalities. Right ovary measures 2.8 x 1.6 x 2.2 cm. Left ovary
measures 3.3 x 1.8 x 1.3 cm. No abnormal ovarian or adnexal masses.
Small volume free fluid in the pelvic cul-de-sac.
IMPRESSION: 1. Single living intrauterine gestation at 7 weeks 3 days by
crown-rump length.
2. Small to moderate perigestational bleed. Normal embryonic cardiac
activity.
3. No ovarian or adnexal abnormality.
4. Small volume free fluid in the pelvic cul-de-sac.

## 2021-07-09 ENCOUNTER — Other Ambulatory Visit: Payer: Self-pay

## 2021-07-09 ENCOUNTER — Emergency Department (HOSPITAL_COMMUNITY)
Admission: EM | Admit: 2021-07-09 | Discharge: 2021-07-09 | Disposition: A | Discharge status: Home / Self Care | Payer: 59 | Attending: Emergency Medicine | Admitting: Emergency Medicine

## 2021-07-09 ENCOUNTER — Encounter (HOSPITAL_COMMUNITY): Payer: Self-pay

## 2021-07-09 DIAGNOSIS — J020 Streptococcal pharyngitis: Secondary | ICD-10-CM | POA: Diagnosis not present

## 2021-07-09 DIAGNOSIS — R000 Tachycardia, unspecified: Secondary | ICD-10-CM | POA: Diagnosis not present

## 2021-07-09 DIAGNOSIS — J029 Acute pharyngitis, unspecified: Secondary | ICD-10-CM | POA: Diagnosis present

## 2021-07-09 LAB — GROUP A STREP BY PCR: Group A Strep by PCR: DETECTED — AB

## 2021-07-09 MED ORDER — DEXAMETHASONE 4 MG PO TABS
10.0000 mg | ORAL_TABLET | Freq: Once | ORAL | Status: AC
  Administered 2021-07-09: 10 mg via ORAL
  Filled 2021-07-09: qty 1

## 2021-07-09 MED ORDER — AMOXICILLIN 500 MG PO CAPS
500.0000 mg | ORAL_CAPSULE | Freq: Once | ORAL | Status: AC
  Administered 2021-07-09: 500 mg via ORAL
  Filled 2021-07-09: qty 1

## 2021-07-09 MED ORDER — AMOXICILLIN 500 MG PO CAPS: 500.0000 mg | ORAL_CAPSULE | Freq: Two times a day (BID) | ORAL | 0 refills | Status: AC

## 2021-07-09 MED ORDER — ACETAMINOPHEN 325 MG PO TABS
650.0000 mg | ORAL_TABLET | Freq: Once | ORAL | Status: AC
  Administered 2021-07-09: 650 mg via ORAL
  Filled 2021-07-09: qty 2

## 2021-07-09 NOTE — ED Provider Notes (Signed)
?[facility]-EMERGENCY DEPT ?Provider Note ? ? ?CSN: [id number] ?Arrival date & time: 07/09/21  2353 ? ?  ? ?History ? ?Chief Complaint  ?Patient presents with  ? Sore Throat  ? ? ?Shelley Mckinney is a 31 y.o. female. ? ?The history is provided by the patient.  ?Sore Throat ?This is a new problem. The current episode started more than 2 days ago. The problem occurs daily. The problem has been gradually worsening. Pertinent negatives include no shortness of breath. The symptoms are aggravated by swallowing. Nothing relieves the symptoms.  ? ?  ? ?Home Medications ?Prior to Admission medications   ?Medication Sig Start Date End Date Taking? Authorizing Provider  ?amoxicillin (AMOXIL) 500 MG capsule Take 1 capsule (500 mg total) by mouth 2 (two) times daily. 07/09/21  Yes Zadie Rhine, MD  ?ibuprofen (ADVIL) 800 MG tablet Take 1 tablet (800 mg total) by mouth every 8 (eight) hours. 06/01/19   Meisinger, Todd, MD  ?Iron-FA-B Cmp-C-Biot-Probiotic (FUSION PLUS) CAPS Take 1 capsule by mouth daily. 03/21/19   [provider]  ?oxyCODONE (OXY IR/ROXICODONE) 5 MG immediate release tablet Take 1 tablet (5 mg total) by mouth every 4 (four) hours as needed for severe pain. 06/01/19   Meisinger, Tawanna Cooler, MD  ?Prenatal Vit-Fe Fumarate-FA (PRENATAL MULTIVITAMIN) TABS tablet Take 1 tablet by mouth daily at 12 noon.    [provider]  ?   ? ?Allergies    ?Patient has no known allergies.   ? ?Review of Systems   ?Review of Systems  ?Constitutional:  Positive for chills.  ?HENT:  Positive for sore throat.   ?Respiratory:  Positive for cough. Negative for shortness of breath.   ?Gastrointestinal:  Negative for vomiting.  ? ?Physical Exam ?Updated Vital Signs ?BP 136/65 (BP Location: Right Arm)   Pulse (!) 110   Temp 99.5 ?F (37.5 ?C) (Oral)   Resp 18   Ht 1.448 m (4\' 9" )   Wt 63.5 kg   SpO2 99%   BMI 30.30 kg/m?  ?Physical Exam ?CONSTITUTIONAL: Well developed/well nourished ?HEAD:  Normocephalic/atraumatic ?EYES: EOMI/PERRL ?ENMT: Mucous membranes moist, uvula midline, no erythema/exudates ?NECK: supple no meningeal signs, no cervical lymphadenopathy ?CV: S1/S2 noted, no murmurs/rubs/gallops noted, tachycardic ?LUNGS: Lungs are clear to auscultation bilaterally, no apparent distress ?NEURO: Pt is awake/alert/appropriate, moves all extremitiesx4.  No facial droop.   ?EXTREMITIES: full ROM ?SKIN: warm, color normal ?PSYCH:anxious ? ?ED Results / Procedures / Treatments   ?Labs ?(all labs ordered are listed, but only abnormal results are displayed) ?Labs Reviewed  ?GROUP A STREP BY PCR - Abnormal; Notable for the following components:  ?    Result Value  ? Group A Strep by PCR DETECTED (*)   ? All other components within normal limits  ? ? ?EKG ?None ? ?Radiology ?No results found. ? ?Procedures ?Procedures  ? ? ?Medications Ordered in ED ?Medications  ?amoxicillin (AMOXIL) capsule 500 mg (has no administration in time range)  ?acetaminophen (TYLENOL) tablet 650 mg (650 mg Oral Given 07/09/21 0305)  ?dexamethasone (DECADRON) tablet 10 mg (10 mg Oral Given 07/09/21 0305)  ? ? ?ED Course/ Medical Decision Making/ A&P ?  ?                        ?Medical Decision Making ?Risk ?OTC drugs. ?Prescription drug management. ? ? ?Patient presents with sore throat and cough.  Patient reports multiple sick contacts.  Patient had to be positive for strep pharyngitis.  She is in no acute distress, handling her secretions.  She will be treated with amoxicillin as an outpatient. ? ? ? ? ? ? ? ?Final Clinical Impression(s) / ED Diagnoses ?Final diagnoses:  ?Strep pharyngitis  ? ? ?Rx / DC Orders ?ED Discharge Orders   ? ?      Ordered  ?  amoxicillin (AMOXIL) 500 MG capsule  2 times daily       ? 07/09/21 0343  ? ?  ?  ? ?  ? ? ?  ?Zadie Rhine, MD ?07/09/21 [phone] ? ?

## 2021-07-09 NOTE — ED Triage Notes (Signed)
Pt reports with sore throat x 3 days. She states that her kids have strept throat.  ?

## 2024-05-03 ENCOUNTER — Ambulatory Visit: Admitting: Nurse Practitioner
